# Patient Record
Sex: Female | Born: 1940 | Race: White | Hispanic: No | Marital: Married | Smoking: Never smoker
Health system: Southern US, Community
[De-identification: ages and names within clinical notes are randomized; demographics above are authoritative.]

## PROBLEM LIST (undated history)

## (undated) DIAGNOSIS — I10 Essential (primary) hypertension: Secondary | ICD-10-CM

## (undated) DIAGNOSIS — N289 Disorder of kidney and ureter, unspecified: Secondary | ICD-10-CM

## (undated) DIAGNOSIS — E785 Hyperlipidemia, unspecified: Secondary | ICD-10-CM

## (undated) DIAGNOSIS — I272 Pulmonary hypertension, unspecified: Secondary | ICD-10-CM

## (undated) DIAGNOSIS — Z8673 Personal history of transient ischemic attack (TIA), and cerebral infarction without residual deficits: Secondary | ICD-10-CM

## (undated) DIAGNOSIS — I340 Nonrheumatic mitral (valve) insufficiency: Secondary | ICD-10-CM

## (undated) DIAGNOSIS — R609 Edema, unspecified: Secondary | ICD-10-CM

## (undated) HISTORY — PX: OTHER SURGICAL HISTORY: SHX169

## (undated) HISTORY — DX: Essential (primary) hypertension: I10

## (undated) HISTORY — PX: TONSILLECTOMY: SUR1361

## (undated) HISTORY — DX: Disorder of kidney and ureter, unspecified: N28.9

## (undated) HISTORY — PX: GALLBLADDER SURGERY: SHX652

## (undated) HISTORY — DX: Nonrheumatic mitral (valve) insufficiency: I34.0

## (undated) HISTORY — PX: TOTAL ABDOMINAL HYSTERECTOMY: SHX209

## (undated) HISTORY — DX: Hyperlipidemia, unspecified: E78.5

## (undated) HISTORY — DX: Personal history of transient ischemic attack (TIA), and cerebral infarction without residual deficits: Z86.73

## (undated) HISTORY — DX: Pulmonary hypertension, unspecified: I27.20

## (undated) HISTORY — DX: Edema, unspecified: R60.9

---

## 2003-10-22 ENCOUNTER — Other Ambulatory Visit: Payer: Self-pay

## 2004-10-16 ENCOUNTER — Ambulatory Visit: Payer: Self-pay | Admitting: Unknown Physician Specialty

## 2005-05-12 ENCOUNTER — Emergency Department: Payer: Self-pay | Admitting: Emergency Medicine

## 2005-11-10 ENCOUNTER — Ambulatory Visit: Payer: Self-pay | Admitting: Unknown Physician Specialty

## 2006-03-23 ENCOUNTER — Ambulatory Visit: Payer: Self-pay | Admitting: Gastroenterology

## 2006-04-05 ENCOUNTER — Emergency Department: Payer: Self-pay | Admitting: Emergency Medicine

## 2007-03-10 ENCOUNTER — Emergency Department: Payer: Self-pay

## 2007-03-29 ENCOUNTER — Ambulatory Visit: Payer: Self-pay | Admitting: Unknown Physician Specialty

## 2007-10-19 ENCOUNTER — Ambulatory Visit: Payer: Self-pay | Admitting: Cardiovascular Disease

## 2007-12-01 ENCOUNTER — Inpatient Hospital Stay: Payer: Self-pay | Admitting: Cardiovascular Disease

## 2007-12-01 ENCOUNTER — Other Ambulatory Visit: Payer: Self-pay

## 2008-03-29 ENCOUNTER — Ambulatory Visit: Payer: Self-pay | Admitting: Unknown Physician Specialty

## 2008-04-18 ENCOUNTER — Ambulatory Visit: Payer: Self-pay | Admitting: Internal Medicine

## 2008-04-27 ENCOUNTER — Ambulatory Visit: Payer: Self-pay | Admitting: Internal Medicine

## 2008-05-19 ENCOUNTER — Ambulatory Visit: Payer: Self-pay | Admitting: Internal Medicine

## 2008-06-19 ENCOUNTER — Ambulatory Visit: Payer: Self-pay | Admitting: Internal Medicine

## 2008-07-27 ENCOUNTER — Ambulatory Visit: Payer: Self-pay | Admitting: Internal Medicine

## 2008-08-13 ENCOUNTER — Ambulatory Visit: Payer: Self-pay | Admitting: Urology

## 2008-08-19 ENCOUNTER — Ambulatory Visit: Payer: Self-pay | Admitting: Internal Medicine

## 2008-09-18 ENCOUNTER — Ambulatory Visit: Payer: Self-pay | Admitting: Internal Medicine

## 2008-10-19 ENCOUNTER — Ambulatory Visit: Payer: Self-pay | Admitting: Internal Medicine

## 2008-11-19 ENCOUNTER — Ambulatory Visit: Payer: Self-pay | Admitting: Internal Medicine

## 2008-12-17 ENCOUNTER — Ambulatory Visit: Payer: Self-pay | Admitting: Internal Medicine

## 2009-02-04 ENCOUNTER — Ambulatory Visit: Payer: Self-pay | Admitting: Internal Medicine

## 2009-02-11 ENCOUNTER — Ambulatory Visit: Payer: Self-pay | Admitting: Internal Medicine

## 2009-02-16 ENCOUNTER — Ambulatory Visit: Payer: Self-pay | Admitting: Internal Medicine

## 2009-03-04 ENCOUNTER — Ambulatory Visit: Payer: Self-pay | Admitting: Internal Medicine

## 2009-03-19 ENCOUNTER — Ambulatory Visit: Payer: Self-pay | Admitting: Internal Medicine

## 2009-04-04 ENCOUNTER — Ambulatory Visit: Payer: Self-pay | Admitting: Internal Medicine

## 2009-04-11 ENCOUNTER — Ambulatory Visit: Payer: Self-pay | Admitting: Unknown Physician Specialty

## 2009-04-18 ENCOUNTER — Ambulatory Visit: Payer: Self-pay | Admitting: Internal Medicine

## 2009-05-28 ENCOUNTER — Ambulatory Visit: Payer: Self-pay | Admitting: Unknown Physician Specialty

## 2009-06-06 ENCOUNTER — Ambulatory Visit: Payer: Self-pay | Admitting: Internal Medicine

## 2009-06-21 ENCOUNTER — Ambulatory Visit: Payer: Self-pay | Admitting: Podiatry

## 2009-07-08 ENCOUNTER — Ambulatory Visit: Payer: Self-pay | Admitting: Internal Medicine

## 2009-07-19 ENCOUNTER — Ambulatory Visit: Payer: Self-pay | Admitting: Internal Medicine

## 2009-08-19 ENCOUNTER — Ambulatory Visit: Payer: Self-pay | Admitting: Internal Medicine

## 2009-10-19 ENCOUNTER — Ambulatory Visit: Payer: Self-pay | Admitting: Internal Medicine

## 2009-11-11 ENCOUNTER — Ambulatory Visit: Payer: Self-pay | Admitting: Internal Medicine

## 2009-11-19 ENCOUNTER — Ambulatory Visit: Payer: Self-pay | Admitting: Internal Medicine

## 2009-12-02 ENCOUNTER — Encounter: Payer: Self-pay | Admitting: Cardiovascular Disease

## 2009-12-02 LAB — CONVERTED CEMR LAB
BUN: 42 mg/dL
Chloride: 101 meq/L
Cholesterol: 152 mg/dL
HDL: 31 mg/dL
Hgb A1c MFr Bld: 7.2 %
LDL Cholesterol: 82.8 mg/dL
Potassium: 5.4 meq/L

## 2009-12-19 ENCOUNTER — Encounter: Payer: Self-pay | Admitting: Cardiovascular Disease

## 2009-12-31 ENCOUNTER — Ambulatory Visit: Payer: Self-pay | Admitting: Cardiovascular Disease

## 2009-12-31 DIAGNOSIS — E785 Hyperlipidemia, unspecified: Secondary | ICD-10-CM | POA: Insufficient documentation

## 2009-12-31 DIAGNOSIS — I080 Rheumatic disorders of both mitral and aortic valves: Secondary | ICD-10-CM

## 2009-12-31 DIAGNOSIS — R609 Edema, unspecified: Secondary | ICD-10-CM | POA: Insufficient documentation

## 2009-12-31 DIAGNOSIS — I10 Essential (primary) hypertension: Secondary | ICD-10-CM | POA: Insufficient documentation

## 2009-12-31 DIAGNOSIS — E119 Type 2 diabetes mellitus without complications: Secondary | ICD-10-CM | POA: Insufficient documentation

## 2010-01-07 ENCOUNTER — Encounter: Payer: Self-pay | Admitting: Cardiovascular Disease

## 2010-01-10 ENCOUNTER — Ambulatory Visit: Payer: Self-pay | Admitting: Internal Medicine

## 2010-01-17 ENCOUNTER — Ambulatory Visit: Payer: Self-pay | Admitting: Unknown Physician Specialty

## 2010-01-18 ENCOUNTER — Ambulatory Visit: Payer: Self-pay | Admitting: Internal Medicine

## 2010-03-05 ENCOUNTER — Ambulatory Visit: Payer: Self-pay | Admitting: Cardiovascular Disease

## 2010-03-24 ENCOUNTER — Ambulatory Visit: Payer: Self-pay | Admitting: Orthopedic Surgery

## 2010-03-27 ENCOUNTER — Telehealth: Payer: Self-pay | Admitting: Cardiovascular Disease

## 2010-03-28 ENCOUNTER — Telehealth: Payer: Self-pay | Admitting: Cardiovascular Disease

## 2010-03-31 ENCOUNTER — Encounter: Payer: Self-pay | Admitting: Cardiovascular Disease

## 2010-03-31 ENCOUNTER — Ambulatory Visit: Payer: Self-pay | Admitting: Orthopedic Surgery

## 2010-04-01 ENCOUNTER — Inpatient Hospital Stay: Payer: Self-pay | Admitting: Orthopedic Surgery

## 2010-05-09 ENCOUNTER — Telehealth: Payer: Self-pay | Admitting: Cardiovascular Disease

## 2010-05-13 ENCOUNTER — Ambulatory Visit: Payer: Self-pay | Admitting: Cardiovascular Disease

## 2010-05-13 ENCOUNTER — Ambulatory Visit: Payer: Self-pay | Admitting: Internal Medicine

## 2010-05-13 DIAGNOSIS — R42 Dizziness and giddiness: Secondary | ICD-10-CM

## 2010-05-15 LAB — CONVERTED CEMR LAB
BUN: 28 mg/dL — ABNORMAL HIGH (ref 6–23)
Creatinine, Ser: 1.77 mg/dL — ABNORMAL HIGH (ref 0.40–1.20)
Potassium: 5.4 meq/L — ABNORMAL HIGH (ref 3.5–5.3)

## 2010-05-19 ENCOUNTER — Ambulatory Visit: Payer: Self-pay | Admitting: Internal Medicine

## 2010-05-26 ENCOUNTER — Telehealth: Payer: Self-pay | Admitting: Cardiovascular Disease

## 2010-05-29 ENCOUNTER — Ambulatory Visit: Payer: Self-pay | Admitting: Cardiovascular Disease

## 2010-05-30 LAB — CONVERTED CEMR LAB
Glucose, Bld: 237 mg/dL — ABNORMAL HIGH (ref 70–99)
Potassium: 4.7 meq/L (ref 3.5–5.3)
Sodium: 132 meq/L — ABNORMAL LOW (ref 135–145)

## 2010-06-25 ENCOUNTER — Ambulatory Visit: Payer: Self-pay | Admitting: Unknown Physician Specialty

## 2010-06-27 ENCOUNTER — Telehealth: Payer: Self-pay | Admitting: Cardiovascular Disease

## 2010-09-09 ENCOUNTER — Ambulatory Visit: Payer: Self-pay | Admitting: Internal Medicine

## 2010-09-18 ENCOUNTER — Ambulatory Visit: Payer: Self-pay | Admitting: Internal Medicine

## 2010-09-26 ENCOUNTER — Telehealth: Payer: Self-pay | Admitting: Cardiovascular Disease

## 2010-10-07 ENCOUNTER — Ambulatory Visit: Payer: Self-pay | Admitting: Cardiovascular Disease

## 2010-10-07 ENCOUNTER — Encounter: Payer: Self-pay | Admitting: Cardiovascular Disease

## 2010-11-03 ENCOUNTER — Inpatient Hospital Stay: Payer: Self-pay | Admitting: Internal Medicine

## 2010-11-17 ENCOUNTER — Ambulatory Visit: Payer: Self-pay | Admitting: Internal Medicine

## 2010-11-17 ENCOUNTER — Encounter: Payer: Self-pay | Admitting: Cardiovascular Disease

## 2010-11-18 NOTE — Progress Notes (Signed)
Summary: PHI  PHI   Imported By: Harlon Flor 01/01/2010 11:31:12  _____________________________________________________________________  External Attachment:    Type:   Image     Comment:   External Document

## 2010-11-18 NOTE — Progress Notes (Signed)
Summary: MEDICATION QUESTION  Phone Note Call from Patient Call back at Home Phone [phone]   Caller: self Call For: [facility] Summary of Call: WAS PRESCRIBED A LIQUID FOR HER POTASSIUM-WOULD LIKE TO KNOW HOW OFTEN TO TAKE IT Initial call taken by: Harlon Flor,  March 28, 2010 9:42 AM  Follow-up for Phone Call        Called spoke with pt, advised to take 30 grams today and 30 grams tomorrow. Pt states she was given 2 bottles of the medication.  Advised pt to call pharmacy and clarify how rx was filled as to how much of each bottle she should take today and tomorrow. Follow-up by: Cloyde Reams RN,  March 28, 2010 9:53 AM

## 2010-11-18 NOTE — Assessment & Plan Note (Signed)
Summary: DIZZINESS ORTHOSTATIC/ALT   Visit Type:  Follow-up Referring Provider:  Mariah Milling Primary Provider:  Francia Greaves, MD  CC:  c/o dizziness when stands up for  several months..  History of Present Illness: Alexis Joseph is a very pleasant 70 year old woman, patient of Dr. 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  She reports that since her surgery at the end of June, she has difficulty standing up without significant dizziness. She has had this for over one year ago it is worse recently. She called last week on Friday and we suggested that she decrease her clonidine to one half tab 3 times a day and hold her Lasix. She was orthostatic by the numbers provided with systolic pressure less than 100 with standing.  She is uncertain of holding the Lasix and decrease in her clonidine as needed better though she believes it has helped. over the weekend, her blood pressure was elevated greater than 150 and she took a full clonidine instead of a half. Typically she takes 1-1/2 clonidine pills . she does not have any symptoms with sitting or lying down. Symptoms only with standing, typically they go away after several seconds though she reports that her symptoms are quite severe.  her blood pressures over the past week at home have typically been in the 130 to 150 range, occasional 160 systolic.    Current Medications (verified): 1)  Levemir Flexpen 100 Unit/ml Soln (Insulin Detemir) .... As Directed 2)  Apidra Solostar 100 Unit/ml Soln (Insulin Glulisine) .... As Directed 3)  Align  Caps (Probiotic Product)  .Marland Kitchen.. 1 By Mouth Once Daily 4)  Tandem 162-115.2 Mg Caps (Ferrous Fum-Iron Polysacch) .Marland Kitchen.. 1 By Mouth Two Times A Day 5)  Plavix 75 Mg Tabs (Clopidogrel Bisulfate) .... Take One Tablet By Mouth Daily 6)  Clonidine Hcl 0.1 Mg Tabs (Clonidine Hcl) .Marland Kitchen.. 1 1/2  Tablet By Mouth Three Times A Day 7)  Aspirin 81 Mg Tbec (Aspirin) .... Take One Tablet By Mouth Daily 8)  Hydralazine Hcl 10 Mg Tabs (Hydralazine Hcl) .Marland Kitchen.. 1 By Mouth Three Times A Day 9)  Aciphex 20 Mg Tbec (Rabeprazole Sodium) .Marland Kitchen.. 1 By Mouth Two Times A Day 10)  Lyrica 75 Mg Caps (Pregabalin) .Marland Kitchen.. 1 By Mouth Two Times A Day 11)  Pravastatin Sodium 40 Mg Tabs (Pravastatin Sodium) .... Take One Tablet By Mouth Daily At Bedtime 12)  Co Q-10 100 Mg Caps (Coenzyme Q10) .Marland Kitchen.. 1 By Mouth Once Daily 13)  Omega-3 Fish Oil 1200 Mg Caps (Omega-3 Fatty Acids) .Marland Kitchen.. 1 By Mouth Once Daily 14)  Vitamin D 1000 Unit Tabs (Cholecalciferol) .Marland Kitchen.. 1 By Mouth Once Daily 15)  Kayexalate  Powd (Sodium Polystyrene Sulfonate) .... Take 30 Grams By Mouth X 2 Days  Allergies (verified): 1)  ! Cipro 2)  ! Amoxicillin 3)  ! Phenergan  Past History:  Past Medical History: Last updated: 12/20/2009 Edema Hypertension Hyperlipidemia Nephropathy History of TIA x 2 Mild to moderate mitral and tricupsid regurgitation Diastolic relaxation abnormality Mild pulmonary hypertension Diabetes  Past Surgical History: Last updated: 12/31/2009 gallbladder Abdominal Hysterectomy-Total Tonsillectomy  Family History: Last updated: 12/31/2009 Father: living 82: healthy, altimizers Mother: deceased 17: MI  Social History: Last  updated: 12/31/2009 Retired  Married  Tobacco Use - No.  Alcohol Use - no Regular Exercise - yes Drug Use - no  Risk Factors: Alcohol Use: 0 (12/31/2009) Caffeine Use: 1-2 (12/31/2009) Exercise: yes (12/31/2009)  Risk Factors: Smoking Status: never (12/31/2009)  Review of Systems  The patient denies fever, weight loss, weight  gain, vision loss, decreased hearing, hoarseness, chest pain, syncope, dyspnea on exertion, peripheral edema, prolonged cough, abdominal pain, incontinence, muscle weakness, depression, and enlarged lymph nodes.         Dizzy with standing.  Vital Signs:  Patient profile:   70 year old female Height:      63 inches Weight:      176 pounds BMI:     31.29 Pulse rate:   84 / minute BP supine:   192 / 100  (left arm) BP sitting:   184 / 92  (left arm) BP standing:   150 / 82  (left arm) Cuff size:   regular  Vitals Entered By: Bishop Dublin, CMA (May 13, 2010 11:22 AM)  Physical Exam  General:  Well developed, well nourished, in no acute distress. Head:  normocephalic and atraumatic Neck:  Neck supple, no JVD. No masses, thyromegaly or abnormal cervical nodes. Lungs:  Clear bilaterally to auscultation and percussion. Heart:  Non-displaced PMI, chest non-tender; regular rate and rhythm, S1, S2 without murmurs, rubs or gallops. Carotid upstroke normal, no bruit.  Pedals normal pulses. No edema, no varicosities. Abdomen:  Bowel sounds positive; abdomen soft and non-tender without masses Msk:  Back normal, normal gait. Muscle strength and tone normal. Pulses:  pulses normal in all 4 extremities Extremities:  No clubbing or cyanosis. Neurologic:  Alert and oriented x 3. Skin:  Intact without lesions or rashes. Psych:  Normal affect.   Impression & Recommendations:  Problem # 1:  ORTHOSTATIC DIZZINESS (ICD-780.4) her blood pressure was dropping quite significantly with standing last week with systolics less than 100. We held her Lasix, cut back on her clonidine. Her symptoms have persisted and her blood pressure is higher today.  On repeat with sitting, her blood pressure is in the 140s systolic. We have ordered a basic metabolic panel to make sure that she is not dehydrated. If her labs look appropriate, I suspect we may have to change her blood pressure medications and shift away  from vasodilator's. One option would be to change her clonidine and/or  hydralazine to tekturna or bystolic.  she has no lower extremity edema and we have told her to continue to hold her diuretic.  Orders: T-Basic Metabolic Panel [id number])  Problem # 2:  EDEMA (ICD-782.3) she currently has no edema and we have held her Lasix.  Problem # 3:  HYPERTENSION, BENIGN (ICD-401.1) I've asked her to monitor her her blood pressure through the next week. She is to call us with her blood pressure numbers. I have suggested that she stay on a hydralazine 10 mg b.i.d. for now but she can take an extra tablet as needed for systolic pressure greater than 150.  Her updated medication list for this problem includes:    Clonidine Hcl 0.1 Mg Tabs (Clonidine hcl) .Marland Kitchen... 1/2  tablet by mouth three times a day    Aspirin 81 Mg Tbec (Aspirin) .Marland Kitchen... Take one tablet by mouth daily    Hydralazine Hcl 10 Mg Tabs (Hydralazine hcl) .Marland Kitchen... Take one tablet by mouth two times a day  Patient Instructions: 1)  Your physician recommends that you schedule a follow-up appointment  in: 2 months  2)  Your physician recommends that you have  lab work today (BMP) 3)  Your physician has recommended you make the following change in your medication: Start taking hydralazine two times a day you can also take one extra hydralazine if BP is elevated.   Prescriptions: HYDRALAZINE HCL 10 MG TABS (HYDRALAZINE HCL) Take one tablet by mouth three times a day  #90 x 2   Entered by:   Benedict Needy, RN   Authorized by:   Dossie Arbour MD   Signed by:   Benedict Needy, RN on 05/13/2010   Method used:   [facility]        [facility] [phone][facility] (retail)       [address] [place], [place]  [address]       Ph: [phone] or [phone]       Fax: [phone]   RxID:   [id number]

## 2010-11-18 NOTE — Assessment & Plan Note (Signed)
Summary: SURGICAL CLEARANCE   Visit Type:  surgical clearance Referring Provider:  Mariah Milling Primary Provider:  Francia Greaves, MD  CC:  don't feel good/  no cp/ no sob/ little edema in ankles and feet.  History of Present Illness: [name] is a very pleasant 70 year old woman, patient of Dr. Lin Givens and Ellis Savage, with a history of chronic renal insufficiency, diabetes, hypertension, hyperlipidemia, TIA x2 with mild to moderate mitral and tricuspid regurgitation, diastolic relaxation abnormality, mild pulmonary hypertension with right ventricular systolic pressures of 43 based on echocardiography in December 2008 who presents for preoperative evaluation prior to right total knee replacement.  overall she states that she has been doing well. She has been as active as she can be given that her right knee causes significant pain with exertion. She denies any significant chest pain, shortness of breath. Her lower stomach swelling has been very mild and she does take a diuretic on an occasional basis. Her blood pressure has also been well controlled on her current medication regimen. She denies any dizziness or lightheadedness.  Current Problems (verified): 1)  Hypertension, Benign  (ICD-401.1) 2)  Edema  (ICD-782.3) 3)  Mitral Regurgitation  (ICD-396.3) 4)  Other and Unspecified Hyperlipidemia  (ICD-272.4) 5)  Dm  (ICD-250.00) 6)  Unspecified Essential Hypertension  (ICD-401.9)  Current Medications (verified): 1)  Levemir Flexpen 100 Unit/ml Soln (Insulin Detemir) .... As Directed 2)  Apidra Solostar 100 Unit/ml Soln (Insulin Glulisine) .... As Directed 3)  Align  Caps (Probiotic Product) .Marland Kitchen.. 1 By Mouth Once Daily 4)  Tandem 162-115.2 Mg Caps (Ferrous Fum-Iron Polysacch) .Marland Kitchen.. 1 By Mouth Two Times A Day 5)  Plavix 75 Mg Tabs (Clopidogrel Bisulfate) .... Take One Tablet By Mouth Daily 6)  Clonidine Hcl 0.1 Mg Tabs (Clonidine Hcl) .Marland Kitchen.. 1 1/2  Tablet By Mouth Three Times A Day 7)  Aspirin 81 Mg  Tbec (Aspirin) .... Take One Tablet By Mouth Daily 8)  Hydralazine Hcl 10 Mg Tabs (Hydralazine Hcl) .Marland Kitchen.. 1 By Mouth Three Times A Day 9)  Aciphex 20 Mg Tbec (Rabeprazole Sodium) .Marland Kitchen.. 1 By Mouth Two Times A Day 10)  Lyrica 75 Mg Caps (Pregabalin) .Marland Kitchen.. 1 By Mouth Two Times A Day 11)  Pravastatin Sodium 40 Mg Tabs (Pravastatin Sodium) .... Take One Tablet By Mouth Daily At Bedtime 12)  Co Q-10 100 Mg Caps (Coenzyme Q10) .Marland Kitchen.. 1 By Mouth Once Daily 13)  Omega-3 Fish Oil 1200 Mg Caps (Omega-3 Fatty Acids) .Marland Kitchen.. 1 By Mouth Once Daily 14)  Vitamin D 1000 Unit Tabs (Cholecalciferol) .Marland Kitchen.. 1 By Mouth Once Daily  Allergies (verified): 1)  ! Cipro 2)  ! Amoxicillin 3)  ! Phenergan  Past History:  Past Medical History: Last updated: 12/20/2009 Edema Hypertension Hyperlipidemia Nephropathy History of TIA x 2 Mild to moderate mitral and tricupsid regurgitation Diastolic relaxation abnormality Mild pulmonary hypertension Diabetes  Past Surgical History: Last updated: 12/31/2009 gallbladder Abdominal Hysterectomy-Total Tonsillectomy  Family History: Last updated: 12/31/2009 Father: living 14: healthy, altimizers Mother: deceased 37: MI  Social History: Last updated: 12/31/2009 Retired  Married  Tobacco Use - No.  Alcohol Use - no Regular Exercise - yes Drug Use - no  Risk Factors: Alcohol Use: 0 (12/31/2009) Caffeine Use: 1-2 (12/31/2009) Exercise: yes (12/31/2009)  Risk Factors: Smoking Status: never (12/31/2009)  Review of Systems  The patient denies fever, weight loss, weight gain, vision loss, decreased hearing, hoarseness, chest pain, syncope, dyspnea on exertion, peripheral edema, prolonged cough, abdominal pain, incontinence, muscle weakness, depression, and enlarged  lymph nodes.    Vital Signs:  Patient profile:   69 year old female Height:      63 inches Weight:      181.25 pounds BMI:     32.22 Pulse rate:   80 / minute Pulse rhythm:   regular BP sitting:    100 / 56  (right arm) Cuff size:   regular  Vitals Entered By: Mercer Pod (Mar 05, 2010 3:22 PM)  Physical Exam  General:  HEENT exam is benign the oropharynx is clear, neck is supple with no JVP or carotid bruits, heart sounds are regular with S1-S2 and no murmurs appreciated, lungs are clear to auscultation with no wheezes rales, abdominal exam is benign, no significant lower extremity edema, neurologic exam is grossly nonfocal, skin is warm and dry, pulses are equal and symmetrical in her upper and lower extremities.    Impression & Recommendations:  Problem # 1:  PRE-OPERATIVE CARDIAC EXAM (ICD-V72.81) she has no history of coronary artery disease. She does have mild pulmonary hypertension based on A. echocardiogram with normal systolic function. She has been using a diuretic on a p.r.n. basis. She has underlying renal insufficiency with baseline creatinine in the low 2 range. This has been stable. Her blood pressure has been well-controlled. She has no symptoms suggestive of angina.  I feel that she would be an acceptable risk for a right total knee replacement with any type of anesthesia required including general anesthesia or spinal blockade.  Given her history of mild pulmonary hypertension and underlying mild valvular disease, I would recommend that during her right total knee surgery that IV fluids be kept to a minimum. Her heart weight is well-controlled and we will hold off on starting her on beta blockade.  she does have labile blood pressures have asked her to monitor her pressures at home. Some of her elevated pressures may be due to her underlying right knee pain. If she has hypotension, I have asked her to contact us as we may need to cut back on some of her clonidine.  Her updated medication list for this problem includes:    Plavix 75 Mg Tabs (Clopidogrel bisulfate) .Marland Kitchen... Take one tablet by mouth daily    Aspirin 81 Mg Tbec (Aspirin) .Marland Kitchen... Take one tablet by mouth  daily  Appended Document: SURGICAL CLEARANCE she will need to stop Plavix 5 days prior to her total knee surgery. We'll call her with the message to remind her.  Appended Document: SURGICAL CLEARANCE Pt is aware to hold Plavix 5 days prior to surgery.  EWJ

## 2010-11-18 NOTE — Progress Notes (Signed)
  Phone Note Call from Patient   Caller: Patient Summary of Call: Ne[facility] a refill [facility] [address]. Initial call taken by: Bishop Dublin, CMA,  September 26, 2010 10:40 AM  Follow-up for Phone Call        Phone Call Completed Follow-up by: Bishop Dublin, CMA,  September 26, 2010 10:41 AM    Prescriptions: CLONIDINE HCL 0.1 MG TABS (CLONIDINE HCL) 1 1/2  tablet by mouth three times a day  #135 x 6   Entered by:   Bishop Dublin, CMA   Authorized by:   Dossie Arbour MD   Signed by:   Bishop Dublin, CMA on 09/26/2010   Method used:   Electronically to        [facility] [phone]* (retail)       [address] [place], [place]  [address]       Ph: [phone] or [phone]       Fax: [phone]   RxID:   [id number]

## 2010-11-18 NOTE — Progress Notes (Signed)
Summary: Refill tecturna  Phone Note Refill Request   [facility]  (pt was given samples of Tekturna at last visit and would like a prescription called in.)  Initial call taken by: [place] Carbo,  June 27, 2010 12:37 PM  Follow-up for Phone Call        Spoke with patient and she is not sure of Teckturna dose. Do you want her to continue her clonidine as well? What dose of teckturna?  BP readings today 96/65 a.m. at noon 130/80. Follow-up by: Bishop Dublin, CMA,  June 27, 2010 2:17 PM    New/Updated Medications: TEKTURNA 150 MG TABS (ALISKIREN FUMARATE) Take one tablet by mouth daily Prescriptions: TEKTURNA 150 MG TABS (ALISKIREN FUMARATE) Take one tablet by mouth daily  #30 x 6   Entered by:   Bishop Dublin, CMA   Authorized by:   Dossie Arbour MD   Signed by:   Bishop Dublin, CMA on 06/27/2010   Method used:   Electronically to        [facility] [phone]* (retail)       [address] [place], [place]  [address]       Ph: [phone] or [phone]       Fax: [phone]   RxID:   [id number]

## 2010-11-18 NOTE — Progress Notes (Signed)
Summary: ELEVATED K+  Phone Note From Other Clinic   Caller: Velna Hatchet (Dr. Rosita Kea) Call For: [facility] Summary of Call: Faxed abnormal lab results. K+  5.5 on 02/21/10 and redrawn 03/24/10 5.8.  Called spoke with pt denies K+ supplements or prescriptions, salt substitutes, citrus fruits, eats bananas occasionally.  Medication list reviewed pt not currently on andy diuretics. Surgeon asking for you to assist in decreasing K+ prior to knee replacement on 6/14. Please advise.  Initial call taken by: Benedict Needy, RN,  March 27, 2010 3:33 PM  Follow-up for Phone Call        Katexelate 30 gr on 6/10 and 6/11 with BMP on 6/13 in Am before surgery on 6/14  Spoke with pt advised of RX and instructions. Will call surgeon and order stat BMP on 6/13 with preop labs. Benedict Needy, RN  March 27, 2010 5:28 PM   Additional Follow-up for Phone Call Additional follow up Details #1::        Called spoke with Velna Hatchet at Dr Rosita Kea office advised of  Kayexalate rx and instructions to recheck K+ stat on 03/31/10 in case additional doses needed to decr K+.  Faxed surgical clearance note, EKG and Phone note with Kayexalate rx. Additional Follow-up by: Cloyde Reams RN,  March 28, 2010 11:11 AM    New/Updated Medications: KAYEXALATE  POWD (SODIUM POLYSTYRENE SULFONATE) Take 30 grams by mouth x 2 days Prescriptions: KAYEXALATE  POWD (SODIUM POLYSTYRENE SULFONATE) Take 30 grams by mouth x 2 days  #60 x 0   Entered by:   Benedict Needy, RN   Authorized by:   Dossie Arbour MD   Signed by:   Benedict Needy, RN on 03/27/2010   Method used:   [facility]ronically to        [facility] [phone]* (retail)       [address] [place], [place]  [address]       Ph: [phone] or [phone]       Fax: [phone]   RxID:   [id number]

## 2010-11-18 NOTE — Progress Notes (Signed)
Summary: SURGICAL CLEARANCE  Phone Note From Other Clinic Call back at [phone]   Caller: DIANE (PRE-ADMIT) Call For: Artrice Kraker Summary of Call: NEEDS SURGICAL CLEARANCE FOR A RIGHT TOTAL KNEE REPLACEMENT ON 6/14-ALSO, THEY ARE UNDER THE IMPRESSION THAT THE PT HAD A STRESS TEST IN MAY- I DO NOT SEE ONE Initial call taken by: Harlon Flor,  March 28, 2010 2:19 PM  Follow-up for Phone Call        Called spoke with Diane, advised no record of a stress test done on pt by [place] in May.  No orders in chart, or mention of in OV note.  Surgical clearance has been faxed to Dr Rosita Kea office.   Follow-up by: Cloyde Reams RN,  March 28, 2010 3:11 PM

## 2010-11-18 NOTE — Progress Notes (Signed)
Summary: BP ISSUES  Phone Note From Other Clinic   Caller: Nurse Summary of Call: PT IS UNDER [facility] FOR KNEE REPLACEMENT.  COURTNEY, NURSE, HAS SOME CONCERNS WITH BP.  IN THE AM BEFORE MEDS IT IS 70'S OVER 40'S.  AFTER MOVING AROUND SOME GOES TO 170'S OVER 80'S.  PCP DR. JEFFRIES WAS ADVISED AND HE HAS TOLD HER TO HOLD LASIX AND CLONIDINE IF BELOW 100 AND TO DECREASE HYDRALIAZAINE TO two times a day.  [facility] JUST WANTED DR. Deonna Krummel TO BE AWARE OF SAME. PT COMPLAINS OF BEING DIZZY.  NURSE CELL NUMBER [phone] Initial call taken by: Park Breed,  May 09, 2010 4:13 PM  Follow-up for Phone Call        Called [facility]'s [facility] nurse Toni Amend pt was orthostatic 180's/70's sitting and 128/60 standing. very dizzy. Benedict Needy, RN  May 09, 2010 4:46 PM   Spoke to [facility] nurse from [facility] per Dr. Mariah Milling hold lasix through weekend and  take 1/2 clonidine if BP >150/90 take whole dose two times a day and increase fluid intake.Toni Amend reported that pt seems to drink enough water but is not there all the time.  Nurse asked when  pt could be seen by Dr. Mariah Milling appointment made for Tuesday at 11:15.  Follow-up by: Benedict Needy, RN,  May 09, 2010 5:15 PM     Appended Document: BP ISSUES Can we see how her BP is running? Tim  Appended Document: BP ISSUES pt being seen in office this am

## 2010-11-18 NOTE — Miscellaneous (Signed)
Summary: labs from [facility]  Clinical Lists Changes  Observations: Added new observation of CO2 PLSM/SER: 29.3 meq/L (12/02/2009 9:34) Added new observation of CL SERUM: 101 meq/L (12/02/2009 9:34) Added new observation of K SERUM: 5.4 meq/L (12/02/2009 9:34) Added new observation of NA: 138.2 meq/L (12/02/2009 9:34) Added new observation of CREATININE: 2.1 mg/dL (16/07/9603 5:40) Added new observation of BUN: 42 mg/dL (98/08/9146 8:29) Added new observation of BG RANDOM: 137 mg/dL (56/21/3086 5:78) Added new observation of SGOT (AST): 24 units/L (12/02/2009 9:34) Added new observation of SGPT (ALT): 20 units/L (12/02/2009 9:34) Added new observation of HGBA1C: 7.2 % (12/02/2009 9:34) Added new observation of TRIGLYCERIDE: 191 mg/dL (46/96/2952 8:41) Added new observation of HDL: 31.0 mg/dL (32/44/0102 7:25) Added new observation of LDL: 82.8 mg/dL (36/64/4034 7:42) Added new observation of CHOLESTEROL: 152 mg/dL (59/56/3875 6:43)      -  Date:  12/02/2009    Cholesterol: 152    LDL: 82.8    HDL: 31.0    Triglycerides: 191    HgbA1c: 7.2    SGPT (ALT): 20    SGOT (AST): 24    BG Random: 137    BUN: 42    Creatinine: 2.1    Sodium: 138.2    Potassium: 5.4    Chloride: 101    CO2 Total: 29.3

## 2010-11-18 NOTE — Letter (Signed)
Summary: Medical Record Release  Medical Record Release   Imported By: Harlon Flor 01/10/2010 08:33:47  _____________________________________________________________________  External Attachment:    Type:   Image     Comment:   External Document

## 2010-11-18 NOTE — Assessment & Plan Note (Signed)
Summary: NP6/AMD   Visit Type:  New Patient Referring Provider:  Mariah Milling Primary Provider:  Francia Greaves, MD  CC:  no cp, no sob, and edema in ankles..  History of Present Illness: Ms. Neyman is a very pleasant 70 year old woman, patient of Dr. 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  She states that overall she has been doing well. She has stable edema and states that this has not been a problem. She is currently off her diuretic for quite some time. She has been taking clonidine 0.15 mg 3 times a day with hydralazine 10 mg 3 times a day. She is uncertain what her blood pressures are as she thought she would come down when in fact she only wrote down the time of day and not the actual blood pressure.  She has no new complaints. She did have to wear a boot on her leg for several months last year. She's had significant knee arthritis and has had 2 cortisone shots and is due to have a shot of "gel "in her knee in the future. She denies shortness of breath or chest discomfort.  Preventive Screening-Counseling & Management  Alcohol-Tobacco     Alcohol drinks/day: 0     Smoking Status: never  Caffeine-Diet-Exercise     Caffeine use/day: 1-2     Does Patient Exercise: yes     Type of exercise: walk      Drug Use:  no.    Current Problems (verified): 1)  Edema  (ICD-782.3) 2)  Mitral Regurgitation  (ICD-396.3) 3)  Other and Unspecified Hyperlipidemia  (ICD-272.4) 4)  Dm  (ICD-250.00) 5)  Unspecified Essential Hypertension  (ICD-401.9)  Current Medications (verified): 1)  Levemir Flexpen 100 Unit/ml Soln (Insulin Detemir) .... As Directed 2)  Apidra Solostar 100 Unit/ml  Soln (Insulin Glulisine) .... As Directed 3)  Align  Caps (Probiotic Product) .Marland Kitchen.. 1 By Mouth Once Daily 4)  Tandem 162-115.2 Mg Caps (Ferrous Fum-Iron Polysacch) .Marland Kitchen.. 1 By Mouth Two Times A Day 5)  Plavix 75 Mg Tabs (Clopidogrel Bisulfate) .... Take One Tablet By Mouth Daily 6)  Clonidine Hcl 0.1 Mg Tabs (Clonidine Hcl) .Marland Kitchen.. 1 1/2  Tablet By Mouth Three Times A Day 7)  Aspirin 81 Mg Tbec (Aspirin) .... Take One Tablet By Mouth Daily 8)  Hydralazine Hcl 10 Mg Tabs (Hydralazine Hcl) .Marland Kitchen.. 1 By Mouth Three Times A Day 9)  Aciphex 20 Mg Tbec (Rabeprazole Sodium) .Marland Kitchen.. 1 By Mouth Two Times A Day 10)  Lyrica 75 Mg Caps (Pregabalin) .Marland Kitchen.. 1 By Mouth Once Daily 11)  Pravastatin Sodium 40 Mg Tabs (Pravastatin Sodium) .... Take One Tablet By Mouth Daily At Bedtime 12)  Co Q-10 100 Mg Caps (Coenzyme Q10) .Marland Kitchen.. 1 By Mouth Once Daily 13)  Omega-3 Fish Oil 1200 Mg Caps (Omega-3 Fatty Acids) .Marland Kitchen.. 1 By Mouth Once Daily 14)  Vitamin D 1000 Unit Tabs (Cholecalciferol) .Marland Kitchen.. 1 By Mouth Once Daily  Allergies (verified): 1)  ! Cipro 2)  ! Amoxicillin 3)  ! Phenergan  Past History:  Past Surgical History: gallbladder Abdominal Hysterectomy-Total Tonsillectomy  Family History: Father: living 11: healthy, altimizers Mother: deceased 57: MI  Social History: Retired  Married  Tobacco Use - No.  Alcohol Use - no Regular Exercise - yes  Drug Use - no Alcohol drinks/day:  0 Smoking Status:  never Caffeine use/day:  1-2 Does Patient Exercise:  yes Drug Use:  no  Review of Systems       The patient complains of weight gain.  The patient denies fever, vision loss, decreased hearing, hoarseness, chest pain, syncope, dyspnea on exertion, peripheral edema, prolonged cough, abdominal pain, incontinence, muscle weakness, depression, and enlarged lymph nodes.         Knee pain  Vital Signs:  Patient profile:   70 year old female Height:      63 inches Weight:      192.50 pounds BMI:     34.22 Pulse rate:    85 / minute Pulse rhythm:   regular BP sitting:   144 / 78  (left arm) Cuff size:   regular  Vitals Entered By: Mercer Pod (December 31, 2009 10:59 AM)  Physical Exam  General:  well-appearing woman in no apparent distress, H. EENT exam is benign, neck is supple with no JVP or carotid bruits, heart sounds regular with S1-S2 no murmurs appreciated, lungs are clear to auscultation with no wheezes or rales, abdominal exam is benign, no significant lower extremity edema, pulses are equal and symmetrical in her upper and lower extremities, neurologic exam is grossly nonfocal, skin is warm and dry.    EKG  Procedure date:  12/31/2009  Findings:      normal sinus rhythm with rate 85 beats per minute, no significant ST or T wave changes.  Impression & Recommendations:  Problem # 1:  OTHER AND UNSPECIFIED HYPERLIPIDEMIA (ICD-272.4) she has been on pravastatin 40 mg daily and will try to obtain a recent lipid panel from Dr. Lin Givens for our records. She has no known coronary artery disease though does have a history of TIAs.  Her updated medication list for this problem includes:    Pravastatin Sodium 40 Mg Tabs (Pravastatin sodium) .Marland Kitchen... 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 She does not appear to have significant lower extremity edema based on today's exam. She is currently off diuretic regiment and is under the guidance of Dr. Lin Givens and Dr.Kiser for her renal insufficiency.  She has no signs of heart failure based on clinical exam and is not symptomatic.  Problem # 3:  DM (ICD-250.00) we'll try to obtain her most recent hemoglobin A1c from records.  Her updated medication list for this problem includes:    Levemir Flexpen 100 Unit/ml Soln  (Insulin detemir) .Marland Kitchen... As directed    Apidra Solostar 100 Unit/ml Soln (Insulin glulisine) .Marland Kitchen... As directed    Aspirin 81 Mg Tbec (Aspirin) .Marland Kitchen... Take one tablet by mouth daily  Problem # 4:  HYPERTENSION, BENIGN (ICD-401.1) Blood pressure is borderline elevated today at 144/78. We've asked her to monitor her blood pressures at home. She has been monitoring it but has not been riding down her numbers. She will call us later this week to give [place] some numbers. She is asked [place] to renew her clonidine and her hydralazine. If her pressures are elevated, we could increase her hydralazine to 25 mg t.i.d. If her pressures are well controlled, we would refill at its current dose of 10 mg t.i.d.  Of note she has increased her clonidine to 0.2 mg t.i.d. On her own.  Her updated medication list for this problem includes:    Clonidine Hcl 0.2 Mg Tabs (Clonidine hcl) .Marland Kitchen... Take one tablet by mouth three times a day    Aspirin 81 Mg Tbec (Aspirin) .Marland Kitchen... Take one tablet by mouth daily    Hydralazine Hcl 10 Mg Tabs (Hydralazine hcl) .Marland Kitchen... 1 by mouth three times a day  Patient Instructions: 1)  Your physician recommends that you schedule a follow-up appointment in: 6 months 2)  Your physician has recommended you make the following change in your medication: clonidine 0.2 mg three times a day- please call when you are need of a refill, hydralazine- please call in 1-2 weeks to let us know how your BP is doing.  If it is ok, we will leave your hydralazine at 10 mg, if it is elevated we will increase your hydralazine to 25 mg

## 2010-11-18 NOTE — Progress Notes (Signed)
Summary: BP  Phone Note From Other Clinic   Caller: Toni Amend Genevieve Norlander) Summary of Call: Message left from [facility] Nurse that Pt's BP still a problem. Attempted to call nurse back LMOM TCB.  Initial call taken by: Benedict Needy, RN,  May 26, 2010 2:10 PM  Follow-up for Phone Call        [place] nurse would like to see pt 2 times this week due to her eratic BPs Follow-up by: Benedict Needy, RN,  May 26, 2010 2:20 PM

## 2010-11-19 ENCOUNTER — Ambulatory Visit: Payer: Self-pay | Admitting: Internal Medicine

## 2010-11-20 NOTE — Assessment & Plan Note (Signed)
Summary: 6 month F/U   Visit Type:  Follow-up Referring Provider:  Mariah Joseph Primary Provider:  Francia Greaves, MD  CC:  Denies chest pain or shortness of breath..  History of Present Illness: Alexis Joseph is a very pleasant 70 year old woman, patient of Dr. 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  Her blood pressure climbed after holding clonidine and she has been taking this medication again. In fact she has been running out and has not been taking it twice a day or 3 times a day as indicated. Her blood pressures have been climbing to systolics in the 180s over the past week. She has indicated she would like a new prescription for clonidine and she is running out. otherwise she feels well. Her edema has significantly improved  EKG shows normal sinus rhythm with rate 93 beats per minute no significant ST or T wave changes    Current Medications (verified): 1)  Levemir Flexpen 100 Unit/ml Soln (Insulin Detemir) .... As Directed 2)  Apidra Solostar 100 Unit/ml Soln (Insulin Glulisine) .... As Directed 3)  Align  Caps (Probiotic Product) .Marland Kitchen.. 1 By Mouth Once Daily 4)  Tandem 162-115.2 Mg Caps (Ferrous Fum-Iron Polysacch) .Marland Kitchen.. 1 By Mouth Two Times A Day 5)  Plavix 75 Mg Tabs (Clopidogrel Bisulfate) .... Take One Tablet By Mouth Daily 6)  Clonidine Hcl 0.1 Mg Tabs (Clonidine Hcl) .Marland Kitchen.. 1 1/2  Tablet By Mouth Three Times A Day 7)  Aspirin 81 Mg Tbec (Aspirin) .... Take One Tablet By Mouth Daily 8)  Hydralazine Hcl 10 Mg Tabs (Hydralazine Hcl) .Marland Kitchen.. 1 By Mouth Three Times A Day 9)  Aciphex  20 Mg Tbec (Rabeprazole Sodium) .Marland Kitchen.. 1 By Mouth Two Times A Day 10)  Lyrica 75 Mg Caps (Pregabalin) .Marland Kitchen.. 1 By Mouth Two Times A Day 11)  Pravastatin Sodium 40 Mg Tabs (Pravastatin Sodium) .... Take One Tablet By Mouth Daily At Bedtime 12)  Co Q-10 100 Mg Caps (Coenzyme Q10) .Marland Kitchen.. 1 By Mouth Once Daily 13)  Omega-3 Fish Oil 1200 Mg Caps (Omega-3 Fatty Acids) .Marland Kitchen.. 1 By Mouth Once Daily 14)  Vitamin D 1000 Unit Tabs (Cholecalciferol) .Marland Kitchen.. 1 By Mouth Once Daily 15)  Kayexalate  Powd (Sodium Polystyrene Sulfonate) .... Take 30 Grams By Mouth X 2 Days 16)  Hydralazine Hcl 10 Mg Tabs (Hydralazine Hcl) .... Take One Tablet By Mouth Three Times A Day 17)  Tekturna 150 Mg Tabs (Aliskiren Fumarate) .... Take One Tablet By Mouth Daily  Allergies (verified): 1)  ! Cipro 2)  ! Amoxicillin 3)  ! Phenergan  Past History:  Past Medical History: Last updated: 12/20/2009 Edema Hypertension Hyperlipidemia Nephropathy History of TIA x 2 Mild to moderate mitral and tricupsid regurgitation Diastolic relaxation abnormality Mild pulmonary hypertension Diabetes  Past Surgical History: Last updated: 12/31/2009 gallbladder Abdominal Hysterectomy-Total Tonsillectomy  Family History: Last updated: 12/31/2009 Father: living 63: healthy, altimizers Mother: deceased 2: MI  Social History: Last updated: 12/31/2009 Retired  Married  Tobacco Use - No.  Alcohol Use - no Regular Exercise - yes Drug Use - no  Risk Factors: Alcohol Use: 0 (12/31/2009) Caffeine Use: 1-2 (12/31/2009) Exercise: yes (12/31/2009)  Risk Factors: Smoking Status: never (12/31/2009)  Review of Systems  The patient denies fever, weight loss, weight gain, vision loss, decreased hearing, hoarseness, chest pain, syncope, dyspnea on exertion, peripheral edema, prolonged cough, abdominal pain, incontinence, muscle weakness, depression, and enlarged lymph nodes.    Vital Signs:  Patient profile:   70 year old female Height:       63 inches Weight:      188 pounds BMI:     33.42 Pulse rate:   93 / minute BP sitting:   180 / 94  (left arm) Cuff size:   large  Vitals Entered By: Bishop Dublin, CMA (October 07, 2010 3:57 PM)  Physical Exam  General:  Well developed, well nourished, in no acute distress. Head:  normocephalic and atraumatic Neck:  Neck supple, no JVD. No masses, thyromegaly or abnormal cervical nodes. Lungs:  Clear bilaterally to auscultation and percussion. Heart:  Non-displaced PMI, chest non-tender; regular rate and rhythm, S1, S2 without murmurs, rubs or gallops. Carotid upstroke normal, no bruit.  Pedals normal pulses. No edema, no varicosities. Msk:  Back normal, normal gait. Muscle strength and tone normal. Pulses:  pulses normal in all 4 extremities Extremities:  No clubbing or cyanosis. Neurologic:  Alert and oriented x 3. Skin:  Intact without lesions or rashes. Psych:  Normal affect.   Impression & Recommendations:  Problem # 1:  HYPERTENSION, BENIGN (ICD-401.1) blood pressure is elevated which I suspect is secondary to her running out of her clonidine and having rebound hypertension. She has been taking it once a day as she is running out over the past week. We have suggested that she take clonidine 0.2 mg b.i.d.. She can take an extra half dose at lunchtime if needed for hypertension. If her pressure continues to be elevated, she can take hydralazine 25 mg p.r.n..  Her updated medication list for this problem includes:    Clonidine Hcl 0.2 Mg Tabs (Clonidine hcl) .Marland Kitchen... Take one tablet by mouth three times a day    Aspirin 81 Mg Tbec (Aspirin) .Marland Kitchen... Take one tablet by mouth daily    Hydralazine Hcl 25 Mg Tabs (Hydralazine hcl) .Marland Kitchen... Take one tablet by mouth three times a day    Tekturna 150 Mg Tabs (Aliskiren fumarate) .Marland Kitchen... Take one tablet by mouth daily  Problem # 2:  EDEMA (ICD-782.3) Edema appears well controlled on today's visit. Typically when her edema is well  controlled, her creatinine might be elevated. This is monitored by her renal physician.  Problem # 3:  MITRAL REGURGITATION (ICD-396.3) No significant symptoms of shortness of breath with exertion. No further testing needed Orders: EKG w/ Interpretation (93000)  Patient Instructions: 1)  Your physician recommends that you schedule a follow-up appointment in: 6 months 2)  Your physician has recommended you make the following change in your medication: INCREASE Clonidine 0.2 three times a day. INCREASE Hydralazine 25mg  three times a day. Prescriptions: HYDRALAZINE HCL 25 MG TABS (HYDRALAZINE HCL) Take one tablet by mouth three times a day  #90 x 12   Entered by:   Lanny Hurst RN   Authorized by:   Dossie Arbour MD   Signed by:   Lanny Hurst RN on 10/07/2010   Method used:   Electronically to        [facility] [phone]* (retail)       [address]       [place], [place]  [address]       Ph:  [phone] or [phone]       Fax: [phone]   RxID:   [id number] CLONIDINE HCL 0.2 MG TABS (CLONIDINE HCL) Take one tablet by mouth three times a day  #90 x 12   Entered by:   Lanny Hurst RN   Authorized by:   Dossie Arbour MD   Signed by:   Lanny Hurst RN on 10/07/2010   Method used:   Electronically to        [facility] [phone]* (retail)       [address] [place], [place]  [address]       Ph: [phone] or [phone]       Fax: [phone]   RxID:   [id number]

## 2010-12-16 NOTE — Letter (Signed)
Summary: [facility]: Follow Up Note  [facility]: Follow Up Note   Imported By: Earl Many 12/10/2010 10:31:42  _____________________________________________________________________  External Attachment:    Type:   Image     Comment:   External Document

## 2010-12-18 ENCOUNTER — Ambulatory Visit: Payer: Self-pay | Admitting: Internal Medicine

## 2011-01-18 ENCOUNTER — Ambulatory Visit: Payer: Self-pay | Admitting: Internal Medicine

## 2011-03-06 ENCOUNTER — Ambulatory Visit: Payer: Self-pay | Admitting: Internal Medicine

## 2011-03-20 ENCOUNTER — Ambulatory Visit: Payer: Self-pay | Admitting: Internal Medicine

## 2011-04-19 ENCOUNTER — Ambulatory Visit: Payer: Self-pay | Admitting: Internal Medicine

## 2011-05-12 ENCOUNTER — Encounter: Payer: Self-pay | Admitting: Cardiovascular Disease

## 2011-06-15 ENCOUNTER — Ambulatory Visit: Payer: Self-pay | Admitting: Internal Medicine

## 2011-06-20 ENCOUNTER — Ambulatory Visit: Payer: Self-pay | Admitting: Internal Medicine

## 2011-08-31 ENCOUNTER — Other Ambulatory Visit: Payer: Self-pay

## 2011-08-31 MED ORDER — ALISKIREN FUMARATE 150 MG PO TABS: 150.0000 mg | ORAL_TABLET | Freq: Every day | ORAL | Status: DC

## 2011-08-31 MED ORDER — HYDRALAZINE HCL 10 MG PO TABS: 10.0000 mg | ORAL_TABLET | Freq: Three times a day (TID) | ORAL | Status: DC

## 2011-08-31 MED ORDER — CLONIDINE HCL 0.2 MG PO TABS: 0.2000 mg | ORAL_TABLET | Freq: Three times a day (TID) | ORAL | Status: DC

## 2011-09-03 ENCOUNTER — Telehealth: Payer: Self-pay

## 2011-09-03 NOTE — Telephone Encounter (Signed)
Refill for clonidine 0.2 mg, tekturna 150 mg daily and hydralazine.

## 2011-09-09 IMAGING — CR DG ABDOMEN 3V
1 series · 5 of 5 positions shown · non-contrast
Comparison: none

REASON FOR EXAM: abd pain, shortness of breath
COMMENTS:

[Series 1: view not recorded · 0.17mm/px · 5 of 5 slices shown]
[im 1/5]
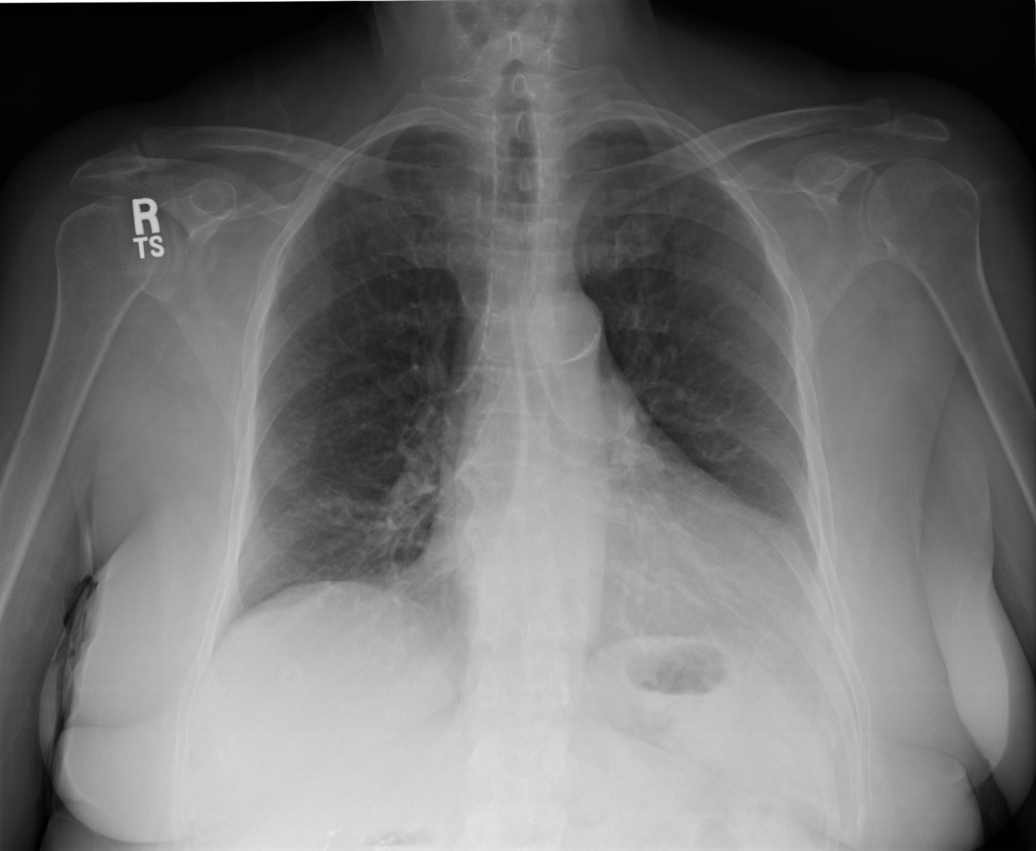
[im 2/5]
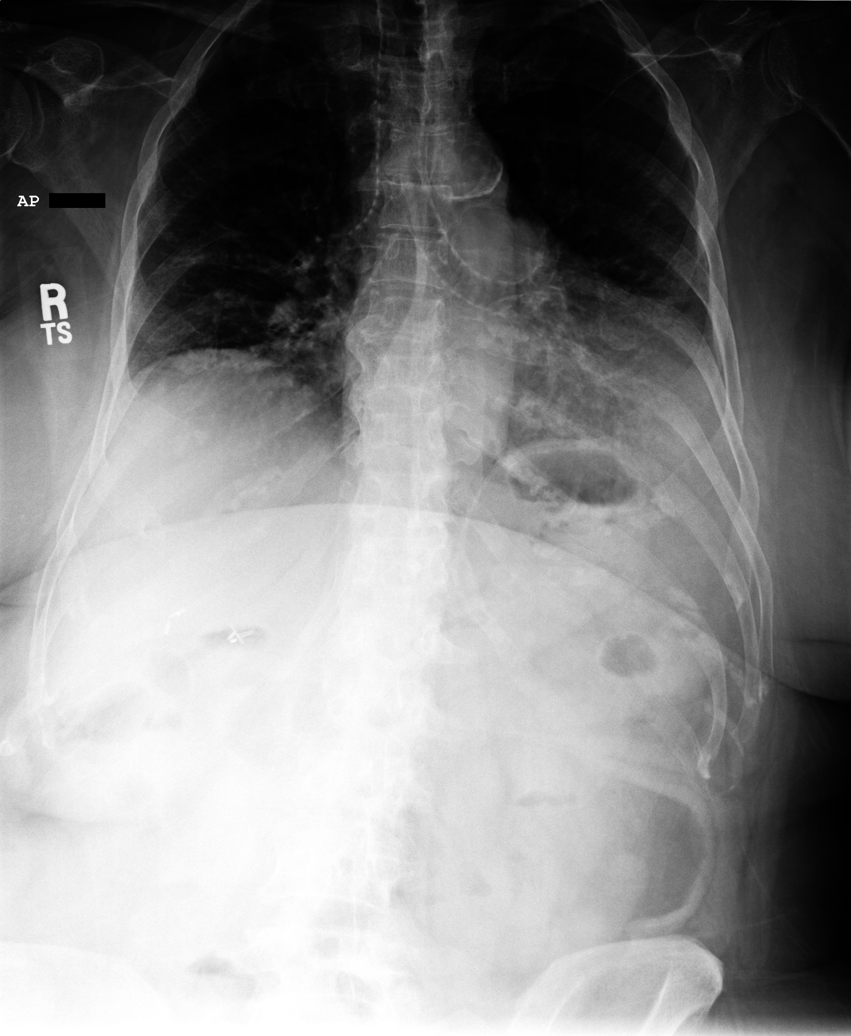
[im 3/5]
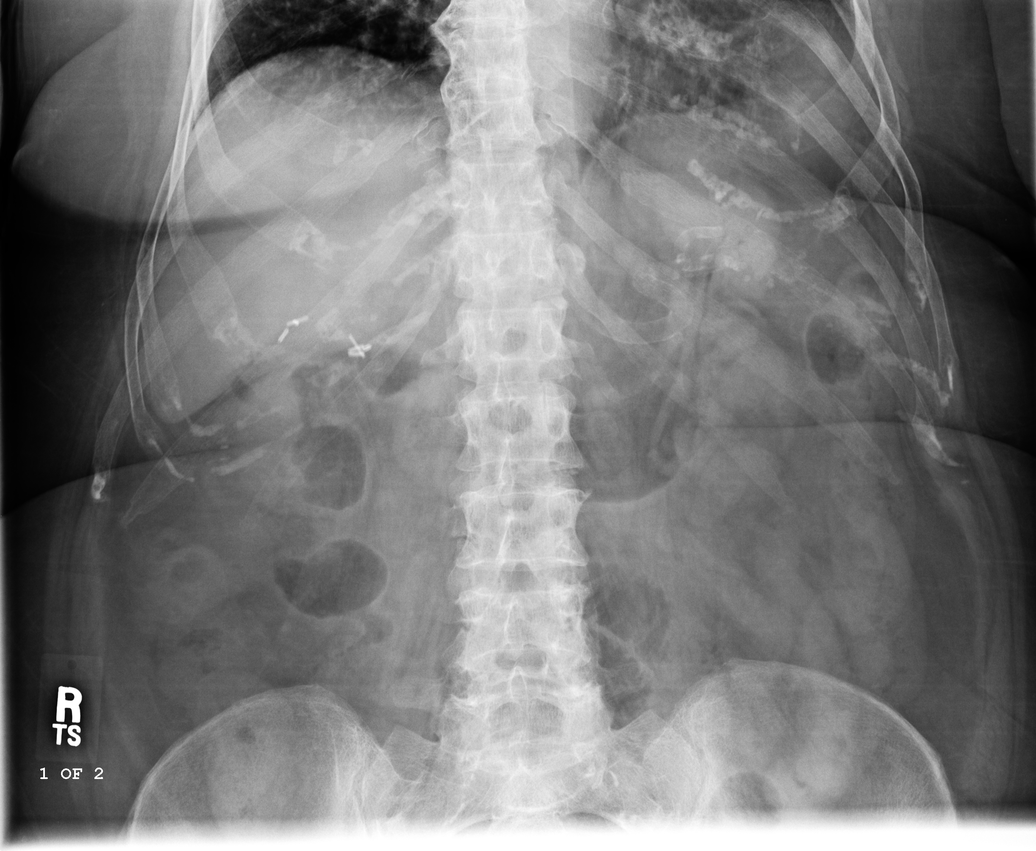
[im 4/5]
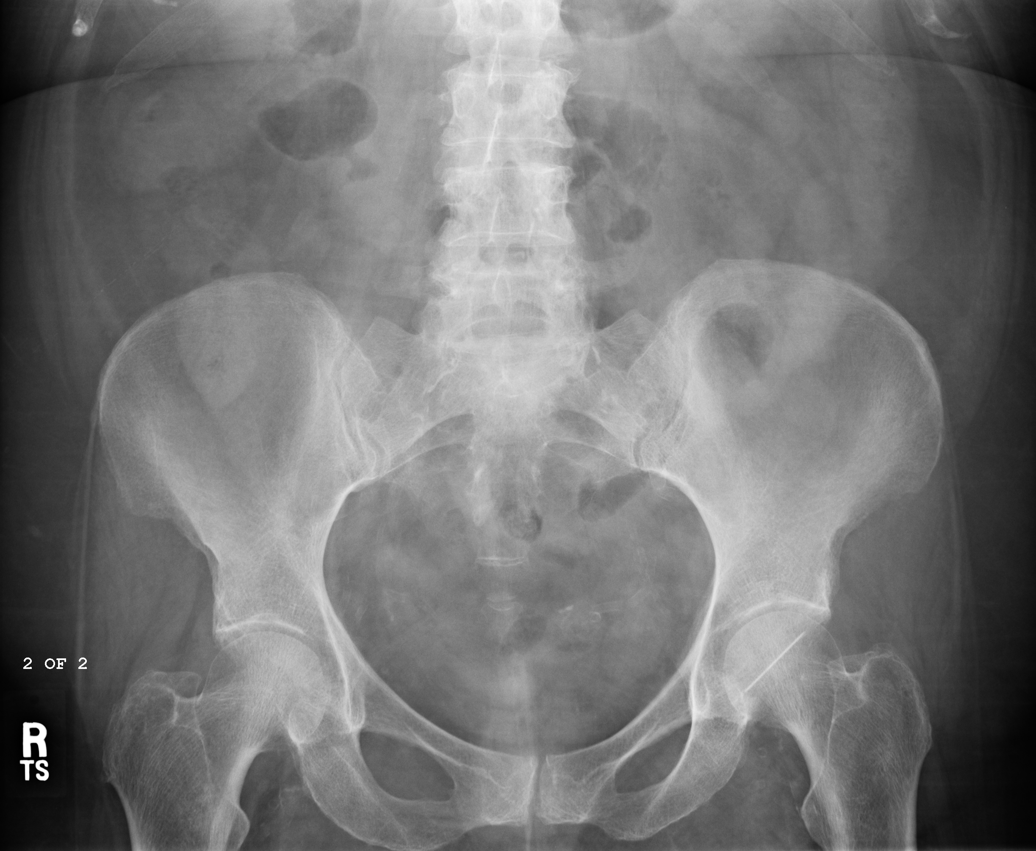
[im 5/5]
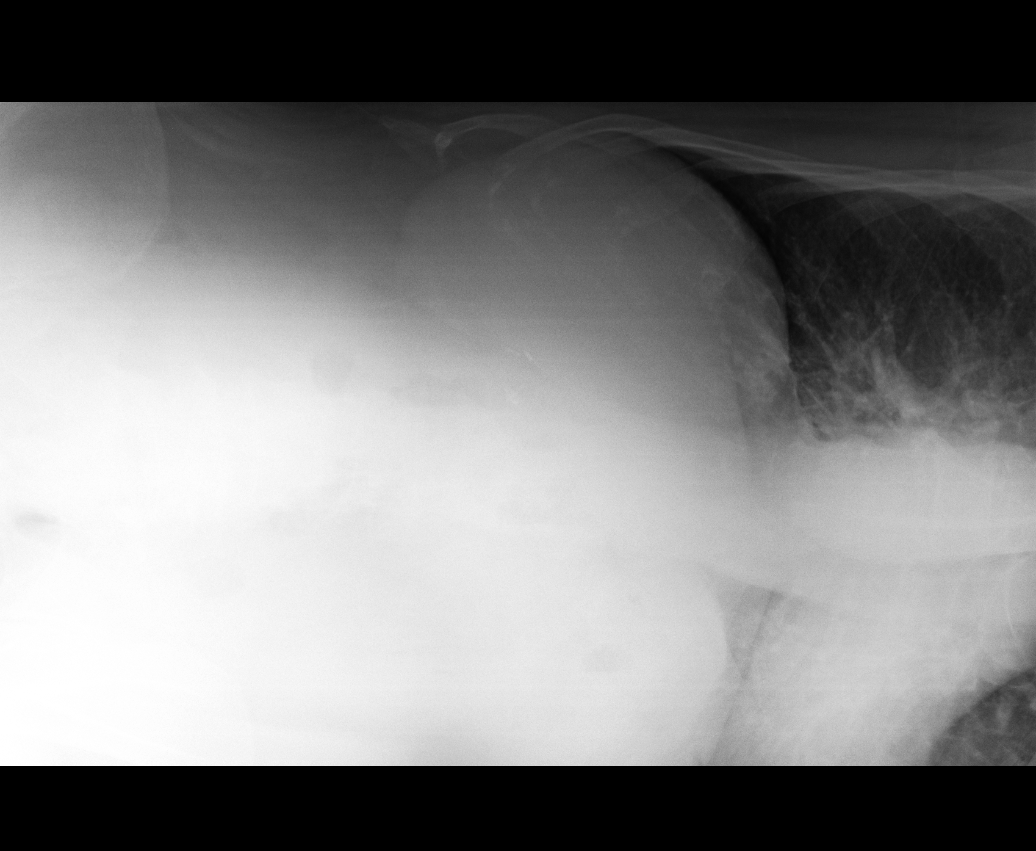

[5 of 5 positions shown; findings below may reference images not displayed]

PROCEDURE:     DXR - DXR ABDOMEN 3-WAY (INCL PA CXR)  - November 03, 2010 [DATE]

RESULT:     Comparison is made to the study of 12/01/2007.

Atherosclerotic calcification is present in the aortic arch. There is a
patchy band of density at the right lung base which is unchanged and likely
secondary to fibrosis. Minimal basilar atelectasis or infiltrate may be
present on the left where there appears to be some consolidation. The
abdominal bowel gas pattern appears normal. Cholecystectomy clips are
present. There is no evidence of abnormal bowel distention. There is some
air within loops of small bowel in the right mid abdomen and mid abdominal
region without gastric distention or free air.
IMPRESSION: 1. Increased density at the left lung base. Correlate for underlying
pneumonia with PA and lateral view chest x-ray.
2. Nonspecific bowel gas pattern.

## 2011-09-21 ENCOUNTER — Encounter: Payer: Self-pay | Admitting: Cardiovascular Disease

## 2011-09-22 ENCOUNTER — Ambulatory Visit: Payer: Self-pay | Admitting: Cardiovascular Disease

## 2011-10-05 ENCOUNTER — Ambulatory Visit: Payer: Self-pay | Admitting: Unknown Physician Specialty

## 2011-10-07 ENCOUNTER — Encounter: Payer: Self-pay | Admitting: Cardiovascular Disease

## 2011-10-07 ENCOUNTER — Ambulatory Visit (INDEPENDENT_AMBULATORY_CARE_PROVIDER_SITE_OTHER): Payer: Medicare Other | Admitting: Cardiovascular Disease

## 2011-10-07 VITALS — BP 100/60 | HR 70 | Ht 63.0 in | Wt 192.0 lb

## 2011-10-07 DIAGNOSIS — I10 Essential (primary) hypertension: Secondary | ICD-10-CM

## 2011-10-07 DIAGNOSIS — E785 Hyperlipidemia, unspecified: Secondary | ICD-10-CM

## 2011-10-07 DIAGNOSIS — R609 Edema, unspecified: Secondary | ICD-10-CM

## 2011-10-07 DIAGNOSIS — N289 Disorder of kidney and ureter, unspecified: Secondary | ICD-10-CM

## 2011-10-07 DIAGNOSIS — E119 Type 2 diabetes mellitus without complications: Secondary | ICD-10-CM

## 2011-10-07 NOTE — Assessment & Plan Note (Signed)
Cholesterol is at goal on the current lipid regimen. No changes to the medications were made.  

## 2011-10-07 NOTE — Assessment & Plan Note (Signed)
Renal insufficiency, baseline creatinine greater than 2. This appears to be stable I her recent lab work. Suspect secondary to long-standing diabetes and hypertension.

## 2011-10-07 NOTE — Assessment & Plan Note (Signed)
Edema is significantly improved. She is not on a diuretic.

## 2011-10-07 NOTE — Patient Instructions (Addendum)
You are doing well. Please hold the hydralazine as your blood pressure is low. Monitor your blood pressure as home and take hydralazine as needed for high blood pressure  Please call us if you have new issues that need to be addressed before your next appt.  Your physician wants you to follow-up in: 12 months.  You will receive a reminder letter in the mail two months in advance. If you don't receive a letter, please call our office to schedule the follow-up appointment.  The phone number for the [facility] primary care office is [phone]

## 2011-10-07 NOTE — Assessment & Plan Note (Signed)
Hemoglobin A1c is mildly elevated. We have suggested she continue to work on her diet and weight loss.

## 2011-10-07 NOTE — Progress Notes (Signed)
Patient ID: Alexis Joseph, female    DOB: 05-12-1941, 70 y.o.   MRN: [id number]  HPI Comments: Alexis Joseph is a very pleasant 70 year old woman with a history of chronic renal insufficiency, diabetes, hypertension, hyperlipidemia, TIA x2 with mild to moderate mitral and tricuspid regurgitation, diastolic relaxation abnormality, mild pulmonary hypertension with right ventricular systolic pressures of 43 based on echocardiography in December 2008, with dizziness on standing on previous visits, that seemed to improve with holding clonidine and lasix temorarily, Who presents for Routine followup.   In the past, she has held clonidine though had significant hypertension. She is back on clonidine and feels well. She does have occasional episodes of dizziness. She does report having systolic pressures sometimes in the 80s. Her edema has been well controlled. She does not take a diuretic.   Lab work from September 2012 shows total cholesterol 137, LDL 82, HDL 33, hemoglobin A1c 7.9, normal LFTs, creatinine 2.6, BUN 50   EKG shows normal sinus rhythm with rate 70 beats per minute no significant ST or T wave changes      Outpatient Encounter Prescriptions as of 10/07/2011  Medication Sig Dispense Refill  . aliskiren (TEKTURNA) 150 MG tablet Take 1 tablet (150 mg total) by mouth daily.  90 tablet  3  . ALPRAZolam (XANAX) 0.25 MG tablet Take 0.25 mg by mouth at bedtime as needed.        Marland Kitchen aspirin 81 MG EC tablet Take 81 mg by mouth daily.        . cholecalciferol (VITAMIN D) 1000 UNITS tablet Take 1,000 Units by mouth daily.        . citalopram (CELEXA) 20 MG tablet Take 20 mg by mouth daily.        . cloNIDine (CATAPRES) 0.2 MG tablet Take 1 tablet (0.2 mg total) by mouth 3 (three) times daily.  270 tablet  3  . clopidogrel (PLAVIX) 75 MG tablet Take 75 mg by mouth daily.        . Coenzyme Q10 (CO Q-10) 100 MG CAPS Take 1 capsule by mouth daily.        Marland Kitchen dexlansoprazole (DEXILANT) 60 MG capsule Take 60  mg by mouth daily.        . ferrous fumarate-iron polysaccharide complex (TANDEM) 162-115.2 MG CAPS Take 1 capsule by mouth 2 (two) times daily.        . hydrALAZINE (APRESOLINE) 25 MG tablet Take 1 tablet (25 mg total) by mouth 3 (three) times daily.  270 tablet  3  . insulin detemir (LEVEMIR) 100 UNIT/ML injection Inject into the skin as directed.        . insulin glulisine (APIDRA) 100 UNIT/ML injection Inject into the skin as directed.        . pravastatin (PRAVACHOL) 40 MG tablet Take 40 mg by mouth at bedtime.        . pregabalin (LYRICA) 75 MG capsule Take 75 mg by mouth 2 (two) times daily.           Review of Systems  Constitutional: Negative.   HENT: Negative.   Eyes: Negative.   Respiratory: Negative.   Cardiovascular: Negative.   Gastrointestinal: Negative.   Musculoskeletal: Negative.   Skin: Negative.   Neurological: Negative.        Rare dizziness  Hematological: Negative.   Psychiatric/Behavioral: Negative.   All other systems reviewed and are negative.     BP 100/60  Pulse 70  Ht 5\' 3"  (1.6 m)  Wt 192  lb (87.091 kg)  BMI 34.01 kg/m2  Physical Exam  Nursing note and vitals reviewed. Constitutional: She is oriented to person, place, and time. She appears well-developed and well-nourished.  HENT:  Head: Normocephalic.  Nose: Nose normal.  Mouth/Throat: Oropharynx is clear and moist.  Eyes: Conjunctivae are normal. Pupils are equal, round, and reactive to light.  Neck: Normal range of motion. Neck supple. No JVD present.  Cardiovascular: Normal rate, regular rhythm, S1 normal, S2 normal, normal heart sounds and intact distal pulses.  Exam reveals no gallop and no friction rub.   No murmur heard. Pulmonary/Chest: Effort normal and breath sounds normal. No respiratory distress. She has no wheezes. She has no rales. She exhibits no tenderness.  Abdominal: Soft. Bowel sounds are normal. She exhibits no distension. There is no tenderness.  Musculoskeletal:  Normal range of motion. She exhibits no edema and no tenderness.  Lymphadenopathy:    She has no cervical adenopathy.  Neurological: She is alert and oriented to person, place, and time. Coordination normal.  Skin: Skin is warm and dry. No rash noted. No erythema.  Psychiatric: She has a normal mood and affect. Her behavior is normal. Judgment and thought content normal.         Assessment and Plan

## 2011-10-07 NOTE — Assessment & Plan Note (Signed)
Blood pressure appears to be low on today's visit. She has systolic pressures less than 100 at home. We have suggested she hold her hydralazine And take this only as needed for high blood pressures.

## 2011-10-26 ENCOUNTER — Telehealth: Payer: Self-pay

## 2011-10-26 MED ORDER — CLONIDINE HCL 0.2 MG PO TABS: 0.2000 mg | ORAL_TABLET | Freq: Three times a day (TID) | ORAL | Status: DC

## 2011-10-26 NOTE — Telephone Encounter (Signed)
Refill sent for clonidine hcl 0.2 mg take one tablet tid

## 2011-12-02 ENCOUNTER — Ambulatory Visit: Payer: Self-pay | Admitting: Internal Medicine

## 2011-12-02 LAB — CBC CANCER CENTER
Basophil #: 0 x10 3/mm (ref 0.0–0.1)
Basophil %: 0.4 %
Eosinophil #: 0.1 x10 3/mm (ref 0.0–0.7)
HCT: 35.2 % (ref 35.0–47.0)
Lymphocyte #: 2.2 x10 3/mm (ref 1.0–3.6)
Lymphocyte %: 27.9 %
MCH: 32.4 pg (ref 26.0–34.0)
MCHC: 33.2 g/dL (ref 32.0–36.0)
Neutrophil #: 4.9 x10 3/mm (ref 1.4–6.5)
RDW: 14.9 % — ABNORMAL HIGH (ref 11.5–14.5)

## 2011-12-18 ENCOUNTER — Ambulatory Visit: Payer: Self-pay | Admitting: Internal Medicine

## 2012-02-10 ENCOUNTER — Other Ambulatory Visit: Payer: Self-pay | Admitting: Cardiovascular Disease

## 2012-02-10 MED ORDER — CLONIDINE HCL 0.2 MG PO TABS: 0.2000 mg | ORAL_TABLET | Freq: Three times a day (TID) | ORAL | Status: AC

## 2012-02-10 NOTE — Telephone Encounter (Signed)
Refilled cloNIDine.

## 2012-08-02 ENCOUNTER — Inpatient Hospital Stay: Payer: Self-pay

## 2012-08-02 LAB — CBC WITH DIFFERENTIAL/PLATELET
Basophil #: 0 10*3/uL (ref 0.0–0.1)
Basophil %: 0.2 %
Eosinophil #: 0.1 10*3/uL (ref 0.0–0.7)
Lymphocyte #: 2.1 10*3/uL (ref 1.0–3.6)
Monocyte %: 7.6 %
Neutrophil #: 16.6 10*3/uL — ABNORMAL HIGH (ref 1.4–6.5)
RDW: 14.5 % (ref 11.5–14.5)
WBC: 20.3 10*3/uL — ABNORMAL HIGH (ref 3.6–11.0)

## 2012-08-02 LAB — COMPREHENSIVE METABOLIC PANEL
Albumin: 3.4 g/dL (ref 3.4–5.0)
Alkaline Phosphatase: 247 U/L — ABNORMAL HIGH (ref 50–136)
Anion Gap: 10 (ref 7–16)
BUN: 55 mg/dL — ABNORMAL HIGH (ref 7–18)
Creatinine: 2.67 mg/dL — ABNORMAL HIGH (ref 0.60–1.30)
EGFR (African American): 20 — ABNORMAL LOW
EGFR (Non-African Amer.): 17 — ABNORMAL LOW
Glucose: 207 mg/dL — ABNORMAL HIGH (ref 65–99)
Osmolality: 299 (ref 275–301)
SGPT (ALT): 26 U/L (ref 12–78)
Sodium: 139 mmol/L (ref 136–145)
Total Protein: 8.9 g/dL — ABNORMAL HIGH (ref 6.4–8.2)

## 2012-08-02 LAB — CK TOTAL AND CKMB (NOT AT ARMC)
CK, Total: 51 U/L (ref 21–215)
CK-MB: 0.8 ng/mL (ref 0.5–3.6)
CK-MB: 1.3 ng/mL (ref 0.5–3.6)

## 2012-08-02 LAB — URINALYSIS, COMPLETE
Bilirubin,UR: NEGATIVE
Glucose,UR: 50 mg/dL (ref 0–75)
Granular Cast: 3
Hyaline Cast: 10
Nitrite: NEGATIVE
Ph: 5 (ref 4.5–8.0)
Protein: 100
Specific Gravity: 1.016 (ref 1.003–1.030)

## 2012-08-02 LAB — TROPONIN I
Troponin-I: <0.02 ng/mL
Troponin-I: <0.02 ng/mL
Troponin-I: 0.02 ng/mL

## 2012-08-03 LAB — CBC WITH DIFFERENTIAL/PLATELET
Basophil #: 0 10*3/uL (ref 0.0–0.1)
Basophil %: 0.2 %
Eosinophil #: 0.2 10*3/uL (ref 0.0–0.7)
HGB: 10 g/dL — ABNORMAL LOW (ref 12.0–16.0)
Lymphocyte #: 3.5 10*3/uL (ref 1.0–3.6)
Lymphocyte %: 36.4 %
MCH: 32.8 pg (ref 26.0–34.0)
MCV: 99 fL (ref 80–100)
Monocyte #: 0.9 x10 3/mm (ref 0.2–0.9)
Monocyte %: 9.5 %
Neutrophil #: 5 10*3/uL (ref 1.4–6.5)
Neutrophil %: 52 %
Platelet: 194 10*3/uL (ref 150–440)
RBC: 3.05 10*6/uL — ABNORMAL LOW (ref 3.80–5.20)
RDW: 14.5 % (ref 11.5–14.5)

## 2012-08-03 LAB — COMPREHENSIVE METABOLIC PANEL
Albumin: 2.5 g/dL — ABNORMAL LOW (ref 3.4–5.0)
Alkaline Phosphatase: 155 U/L — ABNORMAL HIGH (ref 50–136)
Anion Gap: 9 (ref 7–16)
Calcium, Total: 8.2 mg/dL — ABNORMAL LOW (ref 8.5–10.1)
EGFR (African American): 22 — ABNORMAL LOW
EGFR (Non-African Amer.): 19 — ABNORMAL LOW
Glucose: 45 mg/dL — ABNORMAL LOW (ref 65–99)
SGOT(AST): 15 U/L (ref 15–37)
SGPT (ALT): 14 U/L (ref 12–78)
Total Protein: 6.4 g/dL (ref 6.4–8.2)

## 2012-08-03 LAB — MAGNESIUM: Magnesium: 1.8 mg/dL

## 2012-08-03 LAB — PROTIME-INR: INR: 1

## 2012-08-04 LAB — BASIC METABOLIC PANEL
Anion Gap: 8 (ref 7–16)
Calcium, Total: 8.5 mg/dL (ref 8.5–10.1)
Chloride: 113 mmol/L — ABNORMAL HIGH (ref 98–107)
Co2: 23 mmol/L (ref 21–32)
Creatinine: 2.2 mg/dL — ABNORMAL HIGH (ref 0.60–1.30)
EGFR (African American): 25 — ABNORMAL LOW
Osmolality: 293 (ref 275–301)

## 2012-08-04 LAB — CBC WITH DIFFERENTIAL/PLATELET
Eosinophil #: 0.2 10*3/uL (ref 0.0–0.7)
HCT: 30.3 % — ABNORMAL LOW (ref 35.0–47.0)
HGB: 10 g/dL — ABNORMAL LOW (ref 12.0–16.0)
MCHC: 33 g/dL (ref 32.0–36.0)
Monocyte #: 0.8 x10 3/mm (ref 0.2–0.9)
Neutrophil %: 61.1 %
Platelet: 192 10*3/uL (ref 150–440)
RBC: 3.05 10*6/uL — ABNORMAL LOW (ref 3.80–5.20)
WBC: 8.4 10*3/uL (ref 3.6–11.0)

## 2012-08-07 LAB — CULTURE, BLOOD (SINGLE)

## 2012-09-25 ENCOUNTER — Emergency Department: Payer: Self-pay | Admitting: Emergency Medicine

## 2012-09-25 LAB — URINALYSIS, COMPLETE
Bacteria: NONE SEEN
Bilirubin,UR: NEGATIVE
Blood: NEGATIVE
Glucose,UR: >=500 mg/dL (ref 0–75)
Leukocyte Esterase: NEGATIVE
Nitrite: NEGATIVE
RBC,UR: <1 /HPF (ref 0–5)
WBC UR: 5 /HPF (ref 0–5)

## 2012-09-25 LAB — COMPREHENSIVE METABOLIC PANEL
Anion Gap: 7 (ref 7–16)
BUN: 46 mg/dL — ABNORMAL HIGH (ref 7–18)
Bilirubin,Total: 0.4 mg/dL (ref 0.2–1.0)
Chloride: 105 mmol/L (ref 98–107)
Creatinine: 2.66 mg/dL — ABNORMAL HIGH (ref 0.60–1.30)
EGFR (African American): 20 — ABNORMAL LOW
EGFR (Non-African Amer.): 17 — ABNORMAL LOW
Osmolality: 280 (ref 275–301)
Potassium: 4.9 mmol/L (ref 3.5–5.1)
SGOT(AST): 30 U/L (ref 15–37)
Sodium: 134 mmol/L — ABNORMAL LOW (ref 136–145)
Total Protein: 6.7 g/dL (ref 6.4–8.2)

## 2012-09-25 LAB — CBC
HCT: 32.9 % — ABNORMAL LOW (ref 35.0–47.0)
HGB: 10.7 g/dL — ABNORMAL LOW (ref 12.0–16.0)
MCH: 31.9 pg (ref 26.0–34.0)
MCV: 98 fL (ref 80–100)
Platelet: 201 10*3/uL (ref 150–440)
RBC: 3.36 10*6/uL — ABNORMAL LOW (ref 3.80–5.20)
RDW: 13.8 % (ref 11.5–14.5)
WBC: 8.6 10*3/uL (ref 3.6–11.0)

## 2012-09-25 LAB — APTT: Activated PTT: 25.1 secs (ref 23.6–35.9)

## 2012-09-25 LAB — PROTIME-INR: Prothrombin Time: 12.8 secs (ref 11.5–14.7)

## 2012-10-02 ENCOUNTER — Observation Stay: Payer: Self-pay | Admitting: Family Medicine

## 2012-10-02 LAB — COMPREHENSIVE METABOLIC PANEL
Anion Gap: 8 (ref 7–16)
Bilirubin,Total: 0.4 mg/dL (ref 0.2–1.0)
Calcium, Total: 8.8 mg/dL (ref 8.5–10.1)
Chloride: 104 mmol/L (ref 98–107)
Creatinine: 2.71 mg/dL — ABNORMAL HIGH (ref 0.60–1.30)
EGFR (African American): 19 — ABNORMAL LOW
Glucose: 161 mg/dL — ABNORMAL HIGH (ref 65–99)
Osmolality: 290 (ref 275–301)
Potassium: 4.2 mmol/L (ref 3.5–5.1)
SGPT (ALT): 15 U/L (ref 12–78)
Sodium: 138 mmol/L (ref 136–145)
Total Protein: 7.1 g/dL (ref 6.4–8.2)

## 2012-10-02 LAB — CBC
HCT: 36.8 %
HGB: 12.3 g/dL
MCH: 32 pg
MCHC: 33.5 g/dL
MCV: 96 fL
Platelet: 223 10*3/uL
RBC: 3.84 X10 6/mm 3
RDW: 13.8 %
WBC: 10.6 10*3/uL

## 2012-10-02 LAB — TROPONIN I: Troponin-I: <0.02 ng/mL

## 2012-10-03 LAB — CBC WITH DIFFERENTIAL/PLATELET
Basophil %: 0.3 %
Eosinophil #: 0.1 10*3/uL (ref 0.0–0.7)
Eosinophil %: 0.8 %
Lymphocyte %: 31.8 %
MCH: 34.4 pg — ABNORMAL HIGH (ref 26.0–34.0)
Monocyte %: 9.5 %
Neutrophil %: 57.6 %
Platelet: 198 10*3/uL (ref 150–440)
RBC: 3.46 10*6/uL — ABNORMAL LOW (ref 3.80–5.20)
RDW: 13.7 % (ref 11.5–14.5)
WBC: 9.4 10*3/uL (ref 3.6–11.0)

## 2012-10-03 LAB — BASIC METABOLIC PANEL
Anion Gap: 6 — ABNORMAL LOW (ref 7–16)
Calcium, Total: 8.4 mg/dL — ABNORMAL LOW (ref 8.5–10.1)
Chloride: 111 mmol/L — ABNORMAL HIGH (ref 98–107)
Co2: 25 mmol/L (ref 21–32)
Creatinine: 2.83 mg/dL — ABNORMAL HIGH (ref 0.60–1.30)
EGFR (African American): 18 — ABNORMAL LOW
Glucose: 205 mg/dL — ABNORMAL HIGH (ref 65–99)
Osmolality: 301 (ref 275–301)

## 2012-10-04 ENCOUNTER — Encounter: Payer: Self-pay | Admitting: Internal Medicine

## 2012-10-04 LAB — BASIC METABOLIC PANEL
BUN: 46 mg/dL — ABNORMAL HIGH (ref 7–18)
Chloride: 108 mmol/L — ABNORMAL HIGH (ref 98–107)
Creatinine: 2.69 mg/dL — ABNORMAL HIGH (ref 0.60–1.30)
EGFR (Non-African Amer.): 17 — ABNORMAL LOW
Osmolality: 289 (ref 275–301)
Potassium: 4.9 mmol/L (ref 3.5–5.1)
Sodium: 139 mmol/L (ref 136–145)

## 2012-10-11 LAB — URINALYSIS, COMPLETE
Bilirubin,UR: NEGATIVE
Glucose,UR: 50 mg/dL (ref 0–75)
Leukocyte Esterase: NEGATIVE
Nitrite: NEGATIVE
Ph: 5 (ref 4.5–8.0)
RBC,UR: 1 /HPF (ref 0–5)
Squamous Epithelial: 9
WBC UR: 10 /HPF (ref 0–5)

## 2012-10-13 LAB — URINE CULTURE

## 2012-10-19 ENCOUNTER — Encounter: Payer: Self-pay | Admitting: Internal Medicine

## 2012-11-04 ENCOUNTER — Encounter: Payer: Medicare Other | Admitting: Cardiovascular Disease

## 2012-11-04 ENCOUNTER — Encounter: Payer: Self-pay | Admitting: Cardiovascular Disease

## 2012-11-07 ENCOUNTER — Encounter: Payer: Self-pay | Admitting: *Deleted

## 2012-11-19 ENCOUNTER — Ambulatory Visit: Payer: Self-pay | Admitting: Internal Medicine

## 2012-12-10 ENCOUNTER — Inpatient Hospital Stay: Payer: Self-pay

## 2012-12-10 LAB — COMPREHENSIVE METABOLIC PANEL
Albumin: 2.8 g/dL — ABNORMAL LOW (ref 3.4–5.0)
Alkaline Phosphatase: 196 U/L — ABNORMAL HIGH (ref 50–136)
Anion Gap: 13 (ref 7–16)
Calcium, Total: 8.8 mg/dL (ref 8.5–10.1)
Chloride: 107 mmol/L (ref 98–107)
EGFR (Non-African Amer.): 12 — ABNORMAL LOW
Osmolality: 304 (ref 275–301)
Potassium: 5 mmol/L (ref 3.5–5.1)
SGPT (ALT): 20 U/L (ref 12–78)
Sodium: 138 mmol/L (ref 136–145)
Total Protein: 7.4 g/dL (ref 6.4–8.2)

## 2012-12-10 LAB — CBC
HGB: 11.9 g/dL — ABNORMAL LOW (ref 12.0–16.0)
MCH: 30.7 pg (ref 26.0–34.0)
MCHC: 32.3 g/dL (ref 32.0–36.0)
MCV: 95 fL (ref 80–100)
RBC: 3.86 10*6/uL (ref 3.80–5.20)
RDW: 14.6 % — ABNORMAL HIGH (ref 11.5–14.5)
WBC: 18.2 10*3/uL — ABNORMAL HIGH (ref 3.6–11.0)

## 2012-12-10 LAB — LIPASE, BLOOD: Lipase: 211 U/L (ref 73–393)

## 2012-12-11 LAB — URINALYSIS, COMPLETE
Ketone: NEGATIVE
Nitrite: NEGATIVE
Protein: 100
RBC,UR: NONE SEEN /HPF (ref 0–5)

## 2012-12-11 LAB — CBC WITH DIFFERENTIAL/PLATELET
Basophil #: 0 10*3/uL (ref 0.0–0.1)
Basophil %: 0.2 %
Eosinophil %: 0.8 %
Lymphocyte #: 2.5 10*3/uL (ref 1.0–3.6)
Lymphocyte %: 26.5 %
MCH: 31.6 pg (ref 26.0–34.0)
MCHC: 33.4 g/dL (ref 32.0–36.0)
MCV: 95 fL (ref 80–100)
Monocyte #: 0.9 x10 3/mm (ref 0.2–0.9)
Monocyte %: 9.5 %
Neutrophil #: 6.1 10*3/uL (ref 1.4–6.5)
Neutrophil %: 63 %
Platelet: 217 10*3/uL (ref 150–440)
RDW: 14.5 % (ref 11.5–14.5)
WBC: 9.6 10*3/uL (ref 3.6–11.0)

## 2012-12-11 LAB — BASIC METABOLIC PANEL
Anion Gap: 10 (ref 7–16)
Calcium, Total: 7.8 mg/dL — ABNORMAL LOW (ref 8.5–10.1)
Creatinine: 3.42 mg/dL — ABNORMAL HIGH (ref 0.60–1.30)
EGFR (African American): 15 — ABNORMAL LOW
Glucose: 90 mg/dL (ref 65–99)
Osmolality: 305 (ref 275–301)

## 2012-12-11 LAB — WBCS, STOOL

## 2012-12-11 LAB — CLOSTRIDIUM DIFFICILE BY PCR

## 2012-12-12 LAB — CBC WITH DIFFERENTIAL/PLATELET
Basophil %: 0.1 %
HCT: 30.3 % — ABNORMAL LOW (ref 35.0–47.0)
HGB: 10 g/dL — ABNORMAL LOW (ref 12.0–16.0)
Lymphocyte #: 2.1 10*3/uL (ref 1.0–3.6)
MCH: 31.4 pg (ref 26.0–34.0)
Monocyte %: 8.7 %
Neutrophil #: 5.2 10*3/uL (ref 1.4–6.5)
Platelet: 232 10*3/uL (ref 150–440)
RBC: 3.2 10*6/uL — ABNORMAL LOW (ref 3.80–5.20)
WBC: 8.1 10*3/uL (ref 3.6–11.0)

## 2012-12-12 LAB — COMPREHENSIVE METABOLIC PANEL
Albumin: 2.4 g/dL — ABNORMAL LOW (ref 3.4–5.0)
Alkaline Phosphatase: 189 U/L — ABNORMAL HIGH (ref 50–136)
Anion Gap: 9 (ref 7–16)
BUN: 49 mg/dL — ABNORMAL HIGH (ref 7–18)
Bilirubin,Total: 0.4 mg/dL (ref 0.2–1.0)
Calcium, Total: 8.4 mg/dL — ABNORMAL LOW (ref 8.5–10.1)
Chloride: 118 mmol/L — ABNORMAL HIGH (ref 98–107)
Co2: 17 mmol/L — ABNORMAL LOW (ref 21–32)
Creatinine: 3.05 mg/dL — ABNORMAL HIGH (ref 0.60–1.30)
Osmolality: 299 (ref 275–301)
Potassium: 4.5 mmol/L (ref 3.5–5.1)
SGOT(AST): 69 U/L — ABNORMAL HIGH (ref 15–37)
SGPT (ALT): 61 U/L (ref 12–78)
Total Protein: 6.2 g/dL — ABNORMAL LOW (ref 6.4–8.2)

## 2012-12-13 LAB — STOOL CULTURE

## 2013-05-03 ENCOUNTER — Ambulatory Visit: Payer: Self-pay | Admitting: Vascular Surgery

## 2013-05-03 DIAGNOSIS — I10 Essential (primary) hypertension: Secondary | ICD-10-CM

## 2013-05-03 LAB — BASIC METABOLIC PANEL
Anion Gap: 7 (ref 7–16)
BUN: 78 mg/dL — ABNORMAL HIGH (ref 7–18)
Calcium, Total: 8.8 mg/dL (ref 8.5–10.1)
Co2: 23 mmol/L (ref 21–32)
Creatinine: 3.6 mg/dL — ABNORMAL HIGH (ref 0.60–1.30)
EGFR (Non-African Amer.): 12 — ABNORMAL LOW
Glucose: 133 mg/dL — ABNORMAL HIGH (ref 65–99)
Osmolality: 305 (ref 275–301)
Sodium: 140 mmol/L (ref 136–145)

## 2013-05-03 LAB — CBC
HCT: 32.8 % — ABNORMAL LOW (ref 35.0–47.0)
MCH: 31.5 pg (ref 26.0–34.0)
MCHC: 34.1 g/dL (ref 32.0–36.0)
Platelet: 250 10*3/uL (ref 150–440)
RBC: 3.55 10*6/uL — ABNORMAL LOW (ref 3.80–5.20)
RDW: 13.9 % (ref 11.5–14.5)

## 2013-05-10 ENCOUNTER — Ambulatory Visit: Payer: Self-pay | Admitting: Vascular Surgery

## 2013-05-10 LAB — POTASSIUM: Potassium: 4.9 mmol/L (ref 3.5–5.1)

## 2013-10-16 IMAGING — CT CT ABD-PELV W/O CM
1 of 2 series · 15 of 32 positions shown, 19 images · non-contrast
Comparison: none

REASON FOR EXAM: (1) LLQ ain; (2) LLQ pain;    NOTE: Nursing to Give Oral
CT Contrast
COMMENTS:

[Series 2: 3mm soft tissue · axial · 0.73mm/px · z∈[-1198,-790]mm · 15 of 150 slices shown, 19 images]
[im 7/150  soft-tissue]
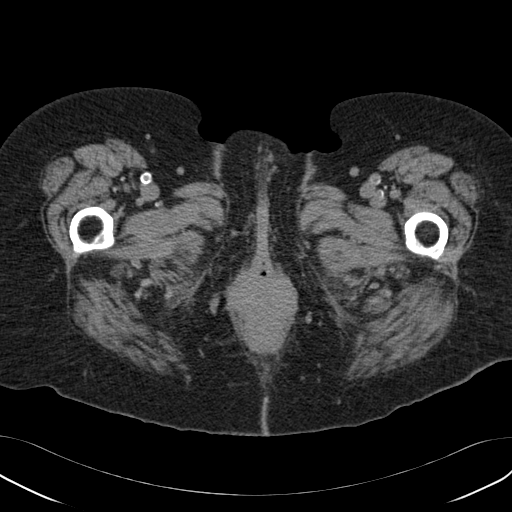
[im 7/150  bone]
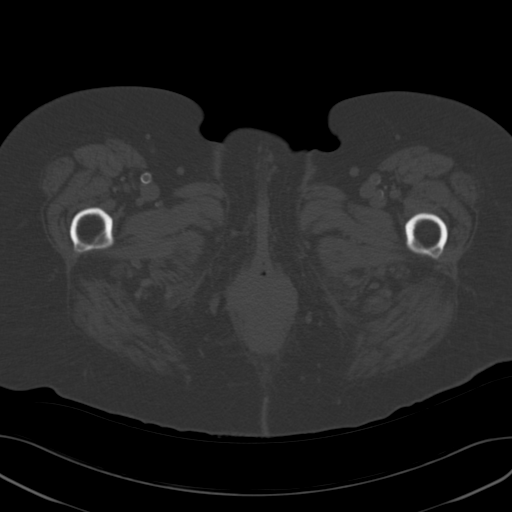
[im 19/150  soft-tissue]
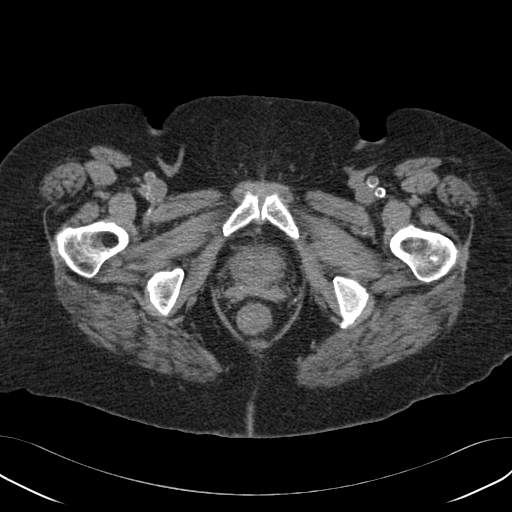
[im 32/150  soft-tissue]
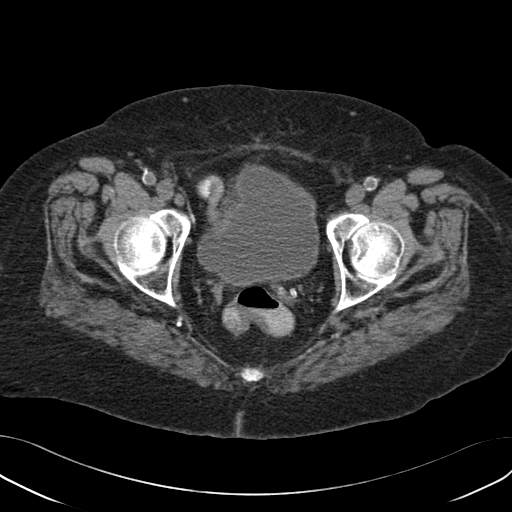
[im 44/150  soft-tissue]
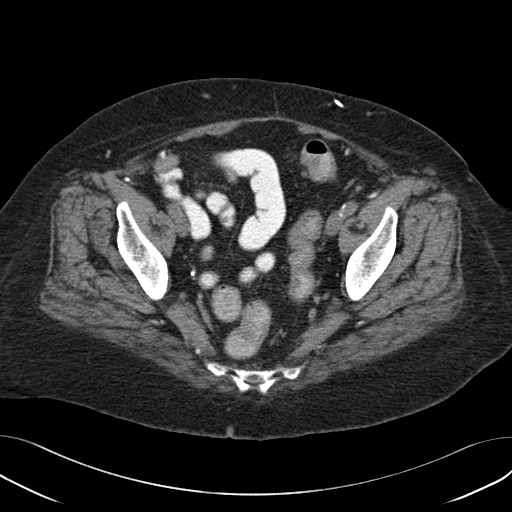
[im 50/150  soft-tissue]
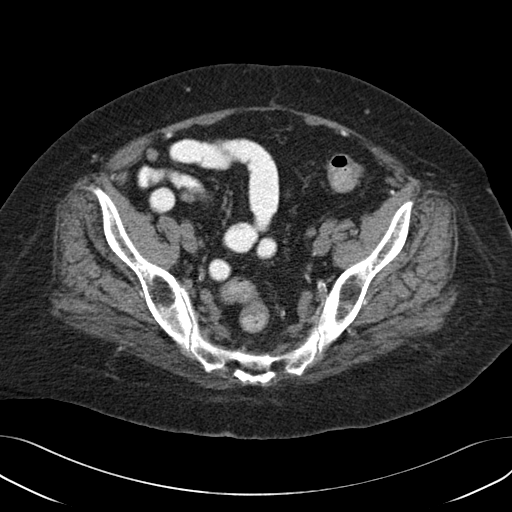
[im 63/150  soft-tissue]
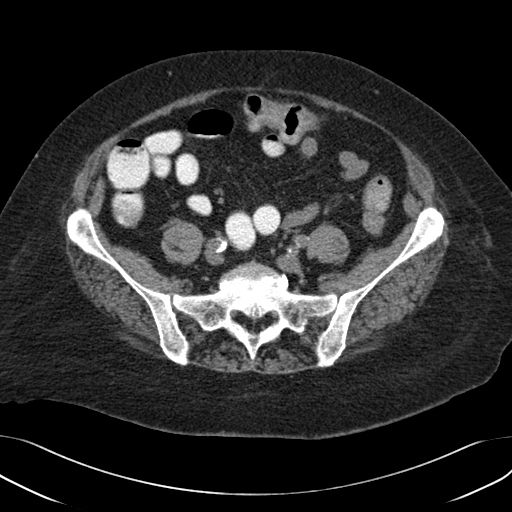
[im 75/150  soft-tissue]
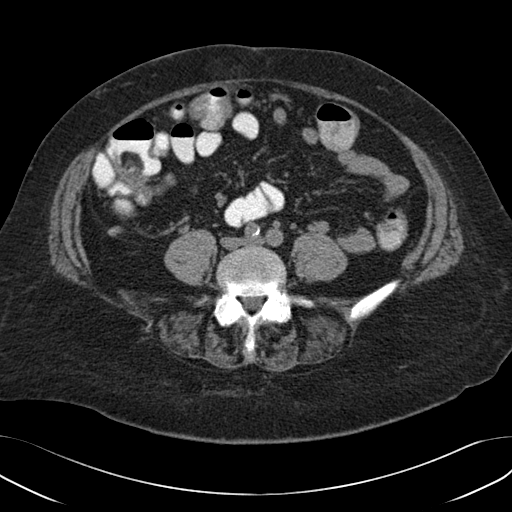
[im 87/150  soft-tissue]
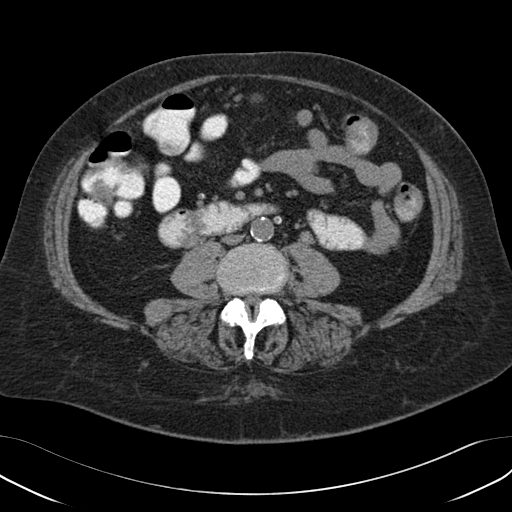
[im 100/150  soft-tissue]
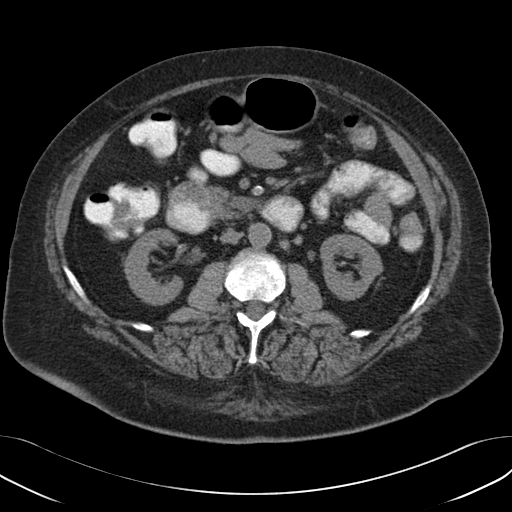
[im 100/150  bone]
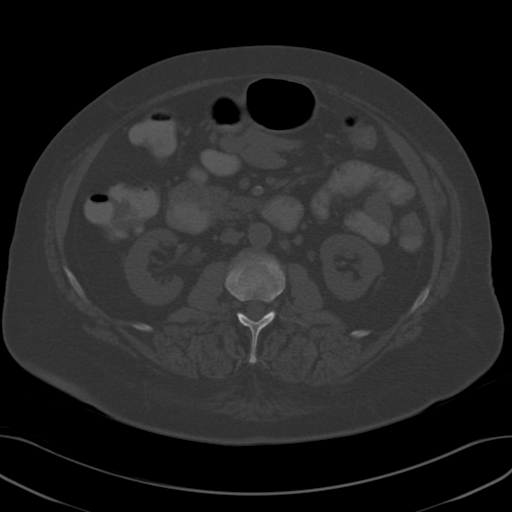
[im 106/150  soft-tissue]
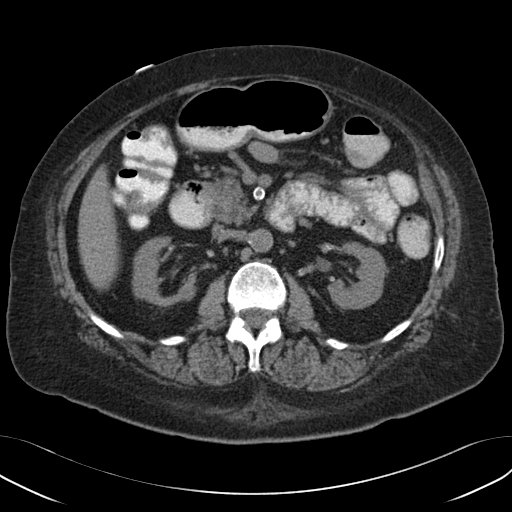
[im 118/150  soft-tissue]
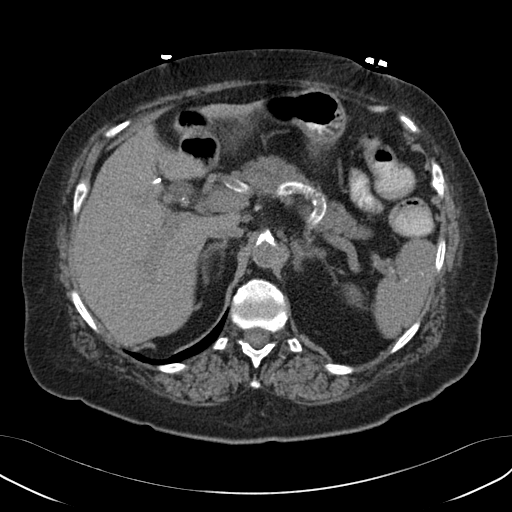
[im 125/150  lung]
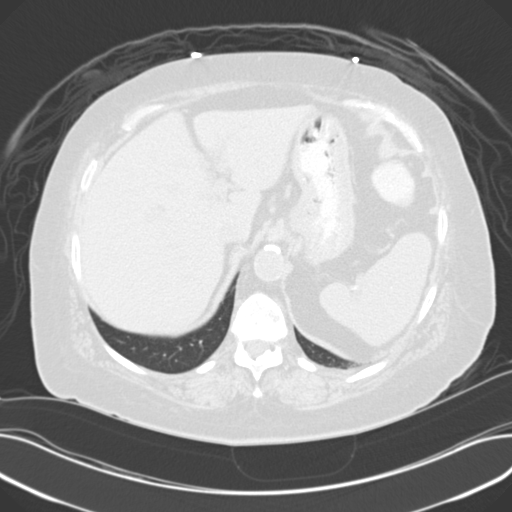
[im 131/150  soft-tissue]
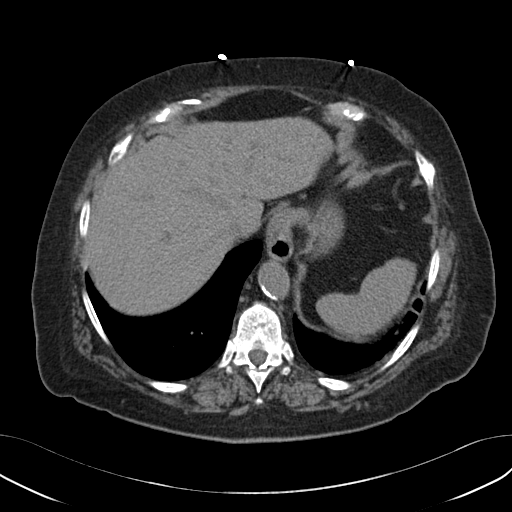
[im 131/150  lung]
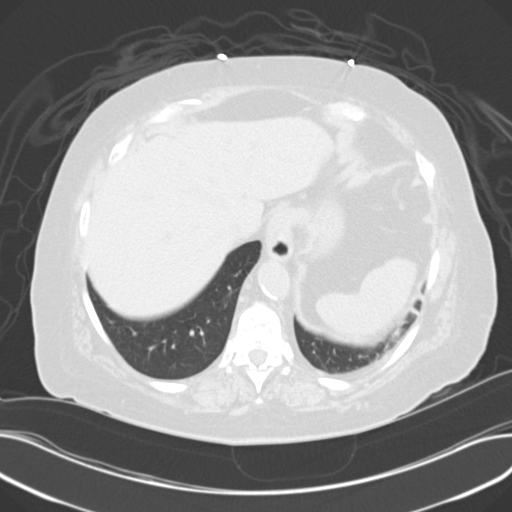
[im 137/150  lung]
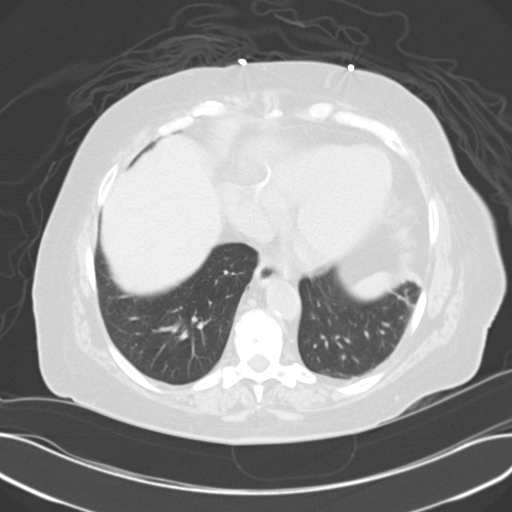
[im 143/150  soft-tissue]
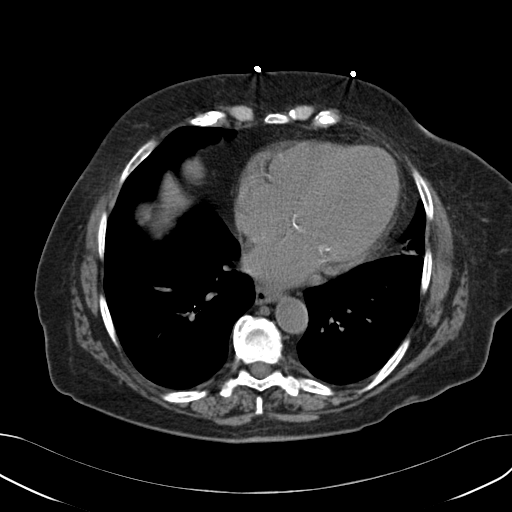
[im 143/150  lung]
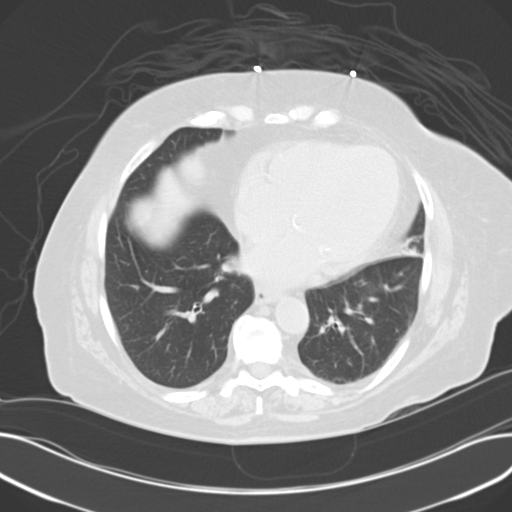

[15 of 32 positions shown; findings below may reference images not displayed]

PROCEDURE:     CT  - CT ABDOMEN AND PELVIS W[DATE]  [DATE]

RESULT:     Axial CT scanning was performed through the abdomen and pelvis
with reconstructions at 3 mm intervals and slice thicknesses. The patient
received oral contrast only. Comparison is made to the study August 02, 2012. Review of multiplanar reconstructed images was performed separately on
the VIA monitor.

The orally administered contrast has traversed the small and large bowel.
There is no evidence of an ileus or obstruction. There are no findings
suspicious for enteritis or  colitis or acute diverticulitis. A normal
appendix is demonstrated. The terminal ileum is normal in appearance.

The kidneys exhibit no evidence of obstruction or inflammatory change. The
gallbladder is surgically absent. The liver, spleen, pancreas, nondistended
stomach, and adrenal glands are normal in appearance. The caliber of the
abdominal aorta is normal. The partially distended urinary bladder is normal
in appearance. The uterus is surgically absent. There are no adnexal masses
. There is no inguinal nor significant umbilical hernia.

The lung bases exhibit minimal atelectasis in the lateral costophrenic
gutter on the left. The lumbar vertebral bodies are preserved in height.
IMPRESSION: 1. There is no evidence of diverticulitis or other forms of colitis. There
is no evidence of bowel obstruction or ileus or perforation. Mild thickening
of the wall of the transverse colon is noted on images 79 through 91 but
this is very similar to that seen on the previous study
2. There is no evidence of urinary tract obstruction nor acute inflammation
of the kidneys.
3. There is no acute hepatobiliary abnormality.
4. There is no evidence of an intra-abdominal abscess more free fluid nor
lymphadenopathy.

[REDACTED]

## 2013-12-21 ENCOUNTER — Ambulatory Visit: Payer: Self-pay

## 2014-01-09 NOTE — Progress Notes (Signed)
This encounter was created in error - please disregard.

## 2014-02-12 ENCOUNTER — Ambulatory Visit: Payer: Self-pay | Admitting: Vascular Surgery

## 2014-02-12 LAB — BASIC METABOLIC PANEL
Anion Gap: 8 (ref 7–16)
BUN: 64 mg/dL — ABNORMAL HIGH (ref 7–18)
CREATININE: 4 mg/dL — AB (ref 0.60–1.30)
Calcium, Total: 9 mg/dL (ref 8.5–10.1)
Chloride: 103 mmol/L (ref 98–107)
Co2: 27 mmol/L (ref 21–32)
EGFR (African American): 12 — ABNORMAL LOW
GFR CALC NON AF AMER: 10 — AB
Glucose: 160 mg/dL — ABNORMAL HIGH (ref 65–99)
Osmolality: 297 (ref 275–301)
Potassium: 4.2 mmol/L (ref 3.5–5.1)
Sodium: 138 mmol/L (ref 136–145)

## 2014-05-13 ENCOUNTER — Inpatient Hospital Stay: Payer: Self-pay

## 2014-05-13 LAB — BASIC METABOLIC PANEL
ANION GAP: 14 (ref 7–16)
BUN: 106 mg/dL — AB (ref 7–18)
CALCIUM: 8.2 mg/dL — AB (ref 8.5–10.1)
Chloride: 97 mmol/L — ABNORMAL LOW (ref 98–107)
Co2: 19 mmol/L — ABNORMAL LOW (ref 21–32)
Creatinine: 7.11 mg/dL — ABNORMAL HIGH (ref 0.60–1.30)
GFR CALC AF AMER: 6 — AB
GFR CALC NON AF AMER: 5 — AB
GLUCOSE: 224 mg/dL — AB (ref 65–99)
Osmolality: 301 (ref 275–301)
Potassium: 4.9 mmol/L (ref 3.5–5.1)
SODIUM: 130 mmol/L — AB (ref 136–145)

## 2014-05-13 LAB — URINALYSIS, COMPLETE
Bilirubin,UR: NEGATIVE
Glucose,UR: 50 mg/dL (ref 0–75)
Ketone: NEGATIVE
Leukocyte Esterase: NEGATIVE
Nitrite: NEGATIVE
PH: 5 (ref 4.5–8.0)
Protein: 100
RBC,UR: 1 /HPF (ref 0–5)
SPECIFIC GRAVITY: 1.009 (ref 1.003–1.030)
WBC UR: 9 /HPF (ref 0–5)

## 2014-05-13 LAB — CBC
HCT: 31.8 % — ABNORMAL LOW (ref 35.0–47.0)
HGB: 10.1 g/dL — ABNORMAL LOW (ref 12.0–16.0)
MCH: 31.3 pg (ref 26.0–34.0)
MCHC: 31.7 g/dL — ABNORMAL LOW (ref 32.0–36.0)
MCV: 99 fL (ref 80–100)
Platelet: 207 10*3/uL (ref 150–440)
RBC: 3.22 10*6/uL — AB (ref 3.80–5.20)
RDW: 14.3 % (ref 11.5–14.5)
WBC: 14 10*3/uL — ABNORMAL HIGH (ref 3.6–11.0)

## 2014-05-13 LAB — PRO B NATRIURETIC PEPTIDE: B-Type Natriuretic Peptide: 13913 pg/mL — ABNORMAL HIGH (ref 0–450)

## 2014-05-13 LAB — TROPONIN I: TROPONIN-I: 0.14 ng/mL — AB

## 2014-05-13 LAB — PROTIME-INR
INR: 1
PROTHROMBIN TIME: 13.3 s (ref 11.5–14.7)

## 2014-05-14 LAB — BASIC METABOLIC PANEL
ANION GAP: 15 (ref 7–16)
BUN: 108 mg/dL — ABNORMAL HIGH (ref 7–18)
Calcium, Total: 8.2 mg/dL — ABNORMAL LOW (ref 8.5–10.1)
Chloride: 98 mmol/L (ref 98–107)
Co2: 20 mmol/L — ABNORMAL LOW (ref 21–32)
Creatinine: 7.39 mg/dL — ABNORMAL HIGH (ref 0.60–1.30)
EGFR (African American): 6 — ABNORMAL LOW
EGFR (Non-African Amer.): 5 — ABNORMAL LOW
Glucose: 160 mg/dL — ABNORMAL HIGH (ref 65–99)
OSMOLALITY: 304 (ref 275–301)
POTASSIUM: 4.7 mmol/L (ref 3.5–5.1)
Sodium: 133 mmol/L — ABNORMAL LOW (ref 136–145)

## 2014-05-14 LAB — CBC WITH DIFFERENTIAL/PLATELET
BASOS PCT: 0.3 %
Basophil #: 0 10*3/uL (ref 0.0–0.1)
EOS PCT: 0 %
Eosinophil #: 0 10*3/uL (ref 0.0–0.7)
HCT: 28.8 % — ABNORMAL LOW (ref 35.0–47.0)
HGB: 9.4 g/dL — AB (ref 12.0–16.0)
LYMPHS ABS: 0.8 10*3/uL — AB (ref 1.0–3.6)
LYMPHS PCT: 7.6 %
MCH: 31.9 pg (ref 26.0–34.0)
MCHC: 32.7 g/dL (ref 32.0–36.0)
MCV: 98 fL (ref 80–100)
Monocyte #: 1.2 x10 3/mm — ABNORMAL HIGH (ref 0.2–0.9)
Monocyte %: 11.9 %
Neutrophil #: 8 10*3/uL — ABNORMAL HIGH (ref 1.4–6.5)
Neutrophil %: 80.2 %
Platelet: 183 10*3/uL (ref 150–440)
RBC: 2.95 10*6/uL — ABNORMAL LOW (ref 3.80–5.20)
RDW: 14 % (ref 11.5–14.5)
WBC: 9.9 10*3/uL (ref 3.6–11.0)

## 2014-05-15 LAB — COMPREHENSIVE METABOLIC PANEL
ALK PHOS: 177 U/L — AB
ANION GAP: 16 (ref 7–16)
Albumin: 2.6 g/dL — ABNORMAL LOW (ref 3.4–5.0)
BUN: 93 mg/dL — ABNORMAL HIGH (ref 7–18)
Bilirubin,Total: 0.7 mg/dL (ref 0.2–1.0)
CHLORIDE: 99 mmol/L (ref 98–107)
CREATININE: 6.41 mg/dL — AB (ref 0.60–1.30)
Calcium, Total: 8.5 mg/dL (ref 8.5–10.1)
Co2: 22 mmol/L (ref 21–32)
EGFR (African American): 7 — ABNORMAL LOW
GFR CALC NON AF AMER: 6 — AB
GLUCOSE: 211 mg/dL — AB (ref 65–99)
OSMOLALITY: 309 (ref 275–301)
POTASSIUM: 4.3 mmol/L (ref 3.5–5.1)
SGOT(AST): 18 U/L (ref 15–37)
SGPT (ALT): 25 U/L
Sodium: 137 mmol/L (ref 136–145)
Total Protein: 7.4 g/dL (ref 6.4–8.2)

## 2014-05-15 LAB — CK TOTAL AND CKMB (NOT AT ARMC)
CK, TOTAL: 147 U/L
CK-MB: 1.2 ng/mL (ref 0.5–3.6)

## 2014-05-18 LAB — PHOSPHORUS: Phosphorus: 4.5 mg/dL (ref 2.5–4.9)

## 2014-05-19 LAB — CBC WITH DIFFERENTIAL/PLATELET
BASOS PCT: 0.4 %
Basophil #: 0 10*3/uL (ref 0.0–0.1)
EOS PCT: 1.9 %
Eosinophil #: 0.2 10*3/uL (ref 0.0–0.7)
HCT: 26.8 % — ABNORMAL LOW (ref 35.0–47.0)
HGB: 8.7 g/dL — AB (ref 12.0–16.0)
LYMPHS PCT: 19.2 %
Lymphocyte #: 1.6 10*3/uL (ref 1.0–3.6)
MCH: 31.9 pg (ref 26.0–34.0)
MCHC: 32.4 g/dL (ref 32.0–36.0)
MCV: 99 fL (ref 80–100)
MONOS PCT: 15.1 %
Monocyte #: 1.2 x10 3/mm — ABNORMAL HIGH (ref 0.2–0.9)
Neutrophil #: 5.2 10*3/uL (ref 1.4–6.5)
Neutrophil %: 63.4 %
PLATELETS: 198 10*3/uL (ref 150–440)
RBC: 2.71 10*6/uL — ABNORMAL LOW (ref 3.80–5.20)
RDW: 14 % (ref 11.5–14.5)
WBC: 8.2 10*3/uL (ref 3.6–11.0)

## 2014-05-19 LAB — COMPREHENSIVE METABOLIC PANEL
ALBUMIN: 2.3 g/dL — AB (ref 3.4–5.0)
AST: 12 U/L — AB (ref 15–37)
Alkaline Phosphatase: 133 U/L — ABNORMAL HIGH
Anion Gap: 9 (ref 7–16)
BILIRUBIN TOTAL: 0.6 mg/dL (ref 0.2–1.0)
BUN: 31 mg/dL — ABNORMAL HIGH (ref 7–18)
CHLORIDE: 97 mmol/L — AB (ref 98–107)
CREATININE: 4.04 mg/dL — AB (ref 0.60–1.30)
Calcium, Total: 8 mg/dL — ABNORMAL LOW (ref 8.5–10.1)
Co2: 31 mmol/L (ref 21–32)
EGFR (Non-African Amer.): 10 — ABNORMAL LOW
GFR CALC AF AMER: 12 — AB
Glucose: 128 mg/dL — ABNORMAL HIGH (ref 65–99)
OSMOLALITY: 282 (ref 275–301)
Potassium: 3.5 mmol/L (ref 3.5–5.1)
SGPT (ALT): 12 U/L — ABNORMAL LOW
Sodium: 137 mmol/L (ref 136–145)
Total Protein: 6.8 g/dL (ref 6.4–8.2)

## 2014-05-19 LAB — PHOSPHORUS: Phosphorus: 3.7 mg/dL (ref 2.5–4.9)

## 2014-05-20 LAB — COMPREHENSIVE METABOLIC PANEL
ALK PHOS: 135 U/L — AB
Albumin: 2.5 g/dL — ABNORMAL LOW (ref 3.4–5.0)
Anion Gap: 12 (ref 7–16)
BUN: 25 mg/dL — ABNORMAL HIGH (ref 7–18)
Bilirubin,Total: 0.7 mg/dL (ref 0.2–1.0)
Calcium, Total: 8.2 mg/dL — ABNORMAL LOW (ref 8.5–10.1)
Chloride: 96 mmol/L — ABNORMAL LOW (ref 98–107)
Co2: 30 mmol/L (ref 21–32)
Creatinine: 3.49 mg/dL — ABNORMAL HIGH (ref 0.60–1.30)
EGFR (African American): 14 — ABNORMAL LOW
EGFR (Non-African Amer.): 12 — ABNORMAL LOW
GLUCOSE: 111 mg/dL — AB (ref 65–99)
Osmolality: 281 (ref 275–301)
Potassium: 3.7 mmol/L (ref 3.5–5.1)
SGOT(AST): 16 U/L (ref 15–37)
SGPT (ALT): 11 U/L — ABNORMAL LOW
Sodium: 138 mmol/L (ref 136–145)
TOTAL PROTEIN: 7 g/dL (ref 6.4–8.2)

## 2014-05-20 LAB — CBC WITH DIFFERENTIAL/PLATELET
Basophil #: 0 10*3/uL (ref 0.0–0.1)
Basophil %: 0.3 %
EOS ABS: 0.1 10*3/uL (ref 0.0–0.7)
Eosinophil %: 1.2 %
HCT: 30 % — ABNORMAL LOW (ref 35.0–47.0)
HGB: 9.6 g/dL — AB (ref 12.0–16.0)
Lymphocyte #: 2.2 10*3/uL (ref 1.0–3.6)
Lymphocyte %: 21.1 %
MCH: 31.8 pg (ref 26.0–34.0)
MCHC: 32.1 g/dL (ref 32.0–36.0)
MCV: 99 fL (ref 80–100)
MONO ABS: 1.4 x10 3/mm — AB (ref 0.2–0.9)
Monocyte %: 13.3 %
NEUTROS ABS: 6.5 10*3/uL (ref 1.4–6.5)
NEUTROS PCT: 64.1 %
PLATELETS: 208 10*3/uL (ref 150–440)
RBC: 3.03 10*6/uL — AB (ref 3.80–5.20)
RDW: 14 % (ref 11.5–14.5)
WBC: 10.2 10*3/uL (ref 3.6–11.0)

## 2014-05-20 LAB — PHOSPHORUS: Phosphorus: 4.5 mg/dL (ref 2.5–4.9)

## 2014-05-21 LAB — COMPREHENSIVE METABOLIC PANEL
ALK PHOS: 126 U/L — AB
ANION GAP: 10 (ref 7–16)
Albumin: 2.3 g/dL — ABNORMAL LOW (ref 3.4–5.0)
BUN: 43 mg/dL — ABNORMAL HIGH (ref 7–18)
Bilirubin,Total: 0.6 mg/dL (ref 0.2–1.0)
CHLORIDE: 91 mmol/L — AB (ref 98–107)
Calcium, Total: 8 mg/dL — ABNORMAL LOW (ref 8.5–10.1)
Co2: 31 mmol/L (ref 21–32)
Creatinine: 5 mg/dL — ABNORMAL HIGH (ref 0.60–1.30)
EGFR (Non-African Amer.): 8 — ABNORMAL LOW
GFR CALC AF AMER: 9 — AB
Glucose: 129 mg/dL — ABNORMAL HIGH (ref 65–99)
Osmolality: 277 (ref 275–301)
Potassium: 3.5 mmol/L (ref 3.5–5.1)
SGOT(AST): 14 U/L — ABNORMAL LOW (ref 15–37)
SGPT (ALT): 11 U/L — ABNORMAL LOW
Sodium: 132 mmol/L — ABNORMAL LOW (ref 136–145)
Total Protein: 6.7 g/dL (ref 6.4–8.2)

## 2014-05-21 LAB — CBC WITH DIFFERENTIAL/PLATELET
BASOS ABS: 0 10*3/uL (ref 0.0–0.1)
Basophil %: 0.3 %
Eosinophil #: 0.3 10*3/uL (ref 0.0–0.7)
Eosinophil %: 2.3 %
HCT: 28.6 % — ABNORMAL LOW (ref 35.0–47.0)
HGB: 9 g/dL — AB (ref 12.0–16.0)
LYMPHS ABS: 2.1 10*3/uL (ref 1.0–3.6)
Lymphocyte %: 19.1 %
MCH: 31 pg (ref 26.0–34.0)
MCHC: 31.4 g/dL — ABNORMAL LOW (ref 32.0–36.0)
MCV: 99 fL (ref 80–100)
MONOS PCT: 11.4 %
Monocyte #: 1.3 x10 3/mm — ABNORMAL HIGH (ref 0.2–0.9)
Neutrophil #: 7.4 10*3/uL — ABNORMAL HIGH (ref 1.4–6.5)
Neutrophil %: 66.9 %
Platelet: 204 10*3/uL (ref 150–440)
RBC: 2.89 10*6/uL — AB (ref 3.80–5.20)
RDW: 13.9 % (ref 11.5–14.5)
WBC: 11.1 10*3/uL — AB (ref 3.6–11.0)

## 2014-05-21 LAB — PHOSPHORUS: Phosphorus: 4.4 mg/dL (ref 2.5–4.9)

## 2014-05-22 ENCOUNTER — Inpatient Hospital Stay: Payer: Self-pay | Admitting: Internal Medicine

## 2014-05-22 LAB — BASIC METABOLIC PANEL
Anion Gap: 10 (ref 7–16)
BUN: 27 mg/dL — ABNORMAL HIGH (ref 7–18)
Calcium, Total: 8.7 mg/dL (ref 8.5–10.1)
Chloride: 94 mmol/L — ABNORMAL LOW (ref 98–107)
Co2: 31 mmol/L (ref 21–32)
Creatinine: 4.69 mg/dL — ABNORMAL HIGH (ref 0.60–1.30)
EGFR (African American): 10 — ABNORMAL LOW
EGFR (Non-African Amer.): 9 — ABNORMAL LOW
Glucose: 194 mg/dL — ABNORMAL HIGH (ref 65–99)
Osmolality: 281 (ref 275–301)
Potassium: 2.9 mmol/L — ABNORMAL LOW (ref 3.5–5.1)
SODIUM: 135 mmol/L — AB (ref 136–145)

## 2014-05-22 LAB — CBC WITH DIFFERENTIAL/PLATELET
BASOS PCT: 0.2 %
Basophil #: 0 10*3/uL (ref 0.0–0.1)
Eosinophil #: 0.1 10*3/uL (ref 0.0–0.7)
Eosinophil %: 0.9 %
HCT: 28.3 % — AB (ref 35.0–47.0)
HGB: 9.3 g/dL — ABNORMAL LOW (ref 12.0–16.0)
Lymphocyte #: 1.6 10*3/uL (ref 1.0–3.6)
Lymphocyte %: 12.5 %
MCH: 32.4 pg (ref 26.0–34.0)
MCHC: 32.9 g/dL (ref 32.0–36.0)
MCV: 98 fL (ref 80–100)
Monocyte #: 1.4 x10 3/mm — ABNORMAL HIGH (ref 0.2–0.9)
Monocyte %: 10.8 %
NEUTROS ABS: 9.7 10*3/uL — AB (ref 1.4–6.5)
NEUTROS PCT: 75.6 %
PLATELETS: 218 10*3/uL (ref 150–440)
RBC: 2.88 10*6/uL — AB (ref 3.80–5.20)
RDW: 14.2 % (ref 11.5–14.5)
WBC: 12.8 10*3/uL — ABNORMAL HIGH (ref 3.6–11.0)

## 2014-05-22 LAB — TROPONIN I: TROPONIN-I: 0.03 ng/mL

## 2014-05-23 LAB — PHOSPHORUS: Phosphorus: 1.3 mg/dL — ABNORMAL LOW (ref 2.5–4.9)

## 2014-05-23 LAB — CBC WITH DIFFERENTIAL/PLATELET
Basophil #: 0.1 10*3/uL (ref 0.0–0.1)
Basophil %: 0.5 %
EOS PCT: 1.5 %
Eosinophil #: 0.2 10*3/uL (ref 0.0–0.7)
HCT: 30 % — ABNORMAL LOW (ref 35.0–47.0)
HGB: 9.4 g/dL — ABNORMAL LOW (ref 12.0–16.0)
LYMPHS ABS: 1.8 10*3/uL (ref 1.0–3.6)
Lymphocyte %: 13.4 %
MCH: 31.3 pg (ref 26.0–34.0)
MCHC: 31.3 g/dL — AB (ref 32.0–36.0)
MCV: 100 fL (ref 80–100)
Monocyte #: 1.4 x10 3/mm — ABNORMAL HIGH (ref 0.2–0.9)
Monocyte %: 10.7 %
Neutrophil #: 9.8 10*3/uL — ABNORMAL HIGH (ref 1.4–6.5)
Neutrophil %: 73.9 %
Platelet: 209 10*3/uL (ref 150–440)
RBC: 3 10*6/uL — AB (ref 3.80–5.20)
RDW: 14.3 % (ref 11.5–14.5)
WBC: 13.3 10*3/uL — AB (ref 3.6–11.0)

## 2014-05-23 LAB — BASIC METABOLIC PANEL
ANION GAP: 10 (ref 7–16)
BUN: 33 mg/dL — ABNORMAL HIGH (ref 7–18)
CALCIUM: 8.5 mg/dL (ref 8.5–10.1)
CO2: 28 mmol/L (ref 21–32)
CREATININE: 5.26 mg/dL — AB (ref 0.60–1.30)
Chloride: 94 mmol/L — ABNORMAL LOW (ref 98–107)
EGFR (African American): 9 — ABNORMAL LOW
EGFR (Non-African Amer.): 7 — ABNORMAL LOW
GLUCOSE: 252 mg/dL — AB (ref 65–99)
Osmolality: 280 (ref 275–301)
POTASSIUM: 4 mmol/L (ref 3.5–5.1)
Sodium: 132 mmol/L — ABNORMAL LOW (ref 136–145)

## 2014-05-24 LAB — BASIC METABOLIC PANEL
Anion Gap: 7 (ref 7–16)
BUN: 17 mg/dL (ref 7–18)
CO2: 33 mmol/L — AB (ref 21–32)
Calcium, Total: 8 mg/dL — ABNORMAL LOW (ref 8.5–10.1)
Chloride: 97 mmol/L — ABNORMAL LOW (ref 98–107)
Creatinine: 3.48 mg/dL — ABNORMAL HIGH (ref 0.60–1.30)
EGFR (African American): 14 — ABNORMAL LOW
GFR CALC NON AF AMER: 12 — AB
GLUCOSE: 127 mg/dL — AB (ref 65–99)
Osmolality: 277 (ref 275–301)
Potassium: 3.7 mmol/L (ref 3.5–5.1)
SODIUM: 137 mmol/L (ref 136–145)

## 2014-05-24 LAB — URINALYSIS, COMPLETE
BILIRUBIN, UR: NEGATIVE
Bacteria: NONE SEEN
Glucose,UR: 50 mg/dL (ref 0–75)
Ketone: NEGATIVE
Nitrite: NEGATIVE
Ph: 6 (ref 4.5–8.0)
Protein: >=500
RBC,UR: 7 /HPF (ref 0–5)
SPECIFIC GRAVITY: 1.012 (ref 1.003–1.030)
Squamous Epithelial: 1
WBC UR: 570 /HPF (ref 0–5)

## 2014-05-24 LAB — URINE CULTURE

## 2014-05-25 LAB — BASIC METABOLIC PANEL
Anion Gap: 5 — ABNORMAL LOW (ref 7–16)
BUN: 24 mg/dL — AB (ref 7–18)
CALCIUM: 7.9 mg/dL — AB (ref 8.5–10.1)
CO2: 31 mmol/L (ref 21–32)
CREATININE: 4.33 mg/dL — AB (ref 0.60–1.30)
Chloride: 98 mmol/L (ref 98–107)
EGFR (Non-African Amer.): 9 — ABNORMAL LOW
GFR CALC AF AMER: 11 — AB
Glucose: 92 mg/dL (ref 65–99)
OSMOLALITY: 272 (ref 275–301)
Potassium: 4 mmol/L (ref 3.5–5.1)
Sodium: 134 mmol/L — ABNORMAL LOW (ref 136–145)

## 2014-05-25 LAB — PHOSPHORUS: PHOSPHORUS: 2.5 mg/dL (ref 2.5–4.9)

## 2014-05-25 LAB — CBC WITH DIFFERENTIAL/PLATELET
BASOS ABS: 0 10*3/uL (ref 0.0–0.1)
Basophil %: 0.4 %
EOS PCT: 2.2 %
Eosinophil #: 0.3 10*3/uL (ref 0.0–0.7)
HCT: 26.5 % — ABNORMAL LOW (ref 35.0–47.0)
HGB: 8.7 g/dL — AB (ref 12.0–16.0)
LYMPHS ABS: 2.6 10*3/uL (ref 1.0–3.6)
LYMPHS PCT: 22.9 %
MCH: 32.3 pg (ref 26.0–34.0)
MCHC: 32.9 g/dL (ref 32.0–36.0)
MCV: 98 fL (ref 80–100)
MONO ABS: 1.2 x10 3/mm — AB (ref 0.2–0.9)
Monocyte %: 10.1 %
NEUTROS ABS: 7.4 10*3/uL — AB (ref 1.4–6.5)
Neutrophil %: 64.4 %
Platelet: 180 10*3/uL (ref 150–440)
RBC: 2.69 10*6/uL — AB (ref 3.80–5.20)
RDW: 14.2 % (ref 11.5–14.5)
WBC: 11.5 10*3/uL — AB (ref 3.6–11.0)

## 2014-05-27 LAB — URINE CULTURE

## 2014-05-28 LAB — CULTURE, BLOOD (SINGLE)

## 2014-06-11 ENCOUNTER — Inpatient Hospital Stay: Payer: Self-pay | Admitting: Internal Medicine

## 2014-06-11 LAB — COMPREHENSIVE METABOLIC PANEL
ALK PHOS: 205 U/L — AB
ALT: 17 U/L
AST: 39 U/L — AB (ref 15–37)
Albumin: 2.5 g/dL — ABNORMAL LOW (ref 3.4–5.0)
Anion Gap: 12 (ref 7–16)
BUN: 14 mg/dL (ref 7–18)
Bilirubin,Total: 0.5 mg/dL (ref 0.2–1.0)
Calcium, Total: 8.1 mg/dL — ABNORMAL LOW (ref 8.5–10.1)
Chloride: 97 mmol/L — ABNORMAL LOW (ref 98–107)
Co2: 30 mmol/L (ref 21–32)
Creatinine: 3.58 mg/dL — ABNORMAL HIGH (ref 0.60–1.30)
GFR CALC AF AMER: 14 — AB
GFR CALC NON AF AMER: 12 — AB
GLUCOSE: 116 mg/dL — AB (ref 65–99)
OSMOLALITY: 279 (ref 275–301)
POTASSIUM: 2.9 mmol/L — AB (ref 3.5–5.1)
Sodium: 139 mmol/L (ref 136–145)
Total Protein: 6.8 g/dL (ref 6.4–8.2)

## 2014-06-11 LAB — CBC
HCT: 29.6 % — AB (ref 35.0–47.0)
HGB: 9.5 g/dL — ABNORMAL LOW (ref 12.0–16.0)
MCH: 31.5 pg (ref 26.0–34.0)
MCHC: 32 g/dL (ref 32.0–36.0)
MCV: 98 fL (ref 80–100)
PLATELETS: 205 10*3/uL (ref 150–440)
RBC: 3.01 10*6/uL — ABNORMAL LOW (ref 3.80–5.20)
RDW: 14.9 % — ABNORMAL HIGH (ref 11.5–14.5)
WBC: 12.4 10*3/uL — ABNORMAL HIGH (ref 3.6–11.0)

## 2014-06-11 LAB — CK TOTAL AND CKMB (NOT AT ARMC)
CK, Total: 57 U/L
CK, Total: 28 U/L
CK, Total: 24 U/L — ABNORMAL LOW
CK-MB: 0.5 ng/mL (ref 0.5–3.6)
CK-MB: 0.8 ng/mL (ref 0.5–3.6)
CK-MB: 0.9 ng/mL (ref 0.5–3.6)

## 2014-06-11 LAB — TROPONIN I
TROPONIN-I: 0.16 ng/mL — AB
Troponin-I: 0.04 ng/mL
Troponin-I: 0.18 ng/mL — ABNORMAL HIGH

## 2014-06-11 LAB — PROTIME-INR
INR: 1
Prothrombin Time: 12.7 secs (ref 11.5–14.7)

## 2014-06-11 LAB — PRO B NATRIURETIC PEPTIDE: B-Type Natriuretic Peptide: 10226 pg/mL — ABNORMAL HIGH (ref 0–450)

## 2014-06-11 LAB — APTT: ACTIVATED PTT: 28.3 s (ref 23.6–35.9)

## 2014-06-11 LAB — MAGNESIUM: Magnesium: 2.1 mg/dL

## 2014-06-12 LAB — CBC WITH DIFFERENTIAL/PLATELET
BASOS PCT: 0.6 %
Basophil #: 0 10*3/uL (ref 0.0–0.1)
Eosinophil #: 0.2 10*3/uL (ref 0.0–0.7)
Eosinophil %: 2.4 %
HCT: 26.3 % — AB (ref 35.0–47.0)
HGB: 8.6 g/dL — ABNORMAL LOW (ref 12.0–16.0)
LYMPHS ABS: 1.7 10*3/uL (ref 1.0–3.6)
LYMPHS PCT: 21.6 %
MCH: 32.2 pg (ref 26.0–34.0)
MCHC: 32.6 g/dL (ref 32.0–36.0)
MCV: 99 fL (ref 80–100)
Monocyte #: 0.8 x10 3/mm (ref 0.2–0.9)
Monocyte %: 9.8 %
Neutrophil #: 5.3 10*3/uL (ref 1.4–6.5)
Neutrophil %: 65.6 %
PLATELETS: 197 10*3/uL (ref 150–440)
RBC: 2.66 10*6/uL — ABNORMAL LOW (ref 3.80–5.20)
RDW: 15 % — ABNORMAL HIGH (ref 11.5–14.5)
WBC: 8 10*3/uL (ref 3.6–11.0)

## 2014-06-13 LAB — BASIC METABOLIC PANEL
ANION GAP: 8 (ref 7–16)
BUN: 30 mg/dL — AB (ref 7–18)
CHLORIDE: 100 mmol/L (ref 98–107)
CO2: 31 mmol/L (ref 21–32)
Calcium, Total: 8.1 mg/dL — ABNORMAL LOW (ref 8.5–10.1)
Creatinine: 4.73 mg/dL — ABNORMAL HIGH (ref 0.60–1.30)
EGFR (African American): 10 — ABNORMAL LOW
EGFR (Non-African Amer.): 8 — ABNORMAL LOW
GLUCOSE: 146 mg/dL — AB (ref 65–99)
Osmolality: 286 (ref 275–301)
POTASSIUM: 4.3 mmol/L (ref 3.5–5.1)
SODIUM: 139 mmol/L (ref 136–145)

## 2014-06-13 LAB — RENAL FUNCTION PANEL
ALBUMIN: 2.1 g/dL — AB (ref 3.4–5.0)
Anion Gap: 9 (ref 7–16)
BUN: 30 mg/dL — AB (ref 7–18)
CALCIUM: 7.8 mg/dL — AB (ref 8.5–10.1)
CHLORIDE: 99 mmol/L (ref 98–107)
Co2: 29 mmol/L (ref 21–32)
Creatinine: 4.75 mg/dL — ABNORMAL HIGH (ref 0.60–1.30)
EGFR (African American): 10 — ABNORMAL LOW
EGFR (Non-African Amer.): 8 — ABNORMAL LOW
GLUCOSE: 245 mg/dL — AB (ref 65–99)
Osmolality: 288 (ref 275–301)
PHOSPHORUS: 4.2 mg/dL (ref 2.5–4.9)
POTASSIUM: 4.2 mmol/L (ref 3.5–5.1)
Sodium: 137 mmol/L (ref 136–145)

## 2014-06-13 LAB — PHOSPHORUS: Phosphorus: 4.4 mg/dL (ref 2.5–4.9)

## 2014-06-14 LAB — HEMOGLOBIN: HGB: 7.9 g/dL — AB (ref 12.0–16.0)

## 2014-06-16 LAB — CULTURE, BLOOD (SINGLE)

## 2014-06-19 ENCOUNTER — Ambulatory Visit: Payer: Self-pay | Admitting: Internal Medicine

## 2014-06-20 ENCOUNTER — Emergency Department: Payer: Self-pay | Admitting: Emergency Medicine

## 2014-06-20 LAB — COMPREHENSIVE METABOLIC PANEL
ALBUMIN: 2.3 g/dL — AB (ref 3.4–5.0)
ANION GAP: 5 — AB (ref 7–16)
Alkaline Phosphatase: 141 U/L — ABNORMAL HIGH
BUN: 5 mg/dL — AB (ref 7–18)
Bilirubin,Total: 0.7 mg/dL (ref 0.2–1.0)
CHLORIDE: 98 mmol/L (ref 98–107)
CREATININE: 1.72 mg/dL — AB (ref 0.60–1.30)
Calcium, Total: 8.1 mg/dL — ABNORMAL LOW (ref 8.5–10.1)
Co2: 34 mmol/L — ABNORMAL HIGH (ref 21–32)
EGFR (Non-African Amer.): 29 — ABNORMAL LOW
GFR CALC AF AMER: 33 — AB
GLUCOSE: 156 mg/dL — AB (ref 65–99)
Osmolality: 274 (ref 275–301)
POTASSIUM: 3.2 mmol/L — AB (ref 3.5–5.1)
SGOT(AST): 23 U/L (ref 15–37)
SGPT (ALT): 11 U/L — ABNORMAL LOW
SODIUM: 137 mmol/L (ref 136–145)
Total Protein: 6.4 g/dL (ref 6.4–8.2)

## 2014-06-20 LAB — CBC
HCT: 26.7 % — ABNORMAL LOW (ref 35.0–47.0)
HGB: 8.8 g/dL — AB (ref 12.0–16.0)
MCH: 32.3 pg (ref 26.0–34.0)
MCHC: 32.9 g/dL (ref 32.0–36.0)
MCV: 98 fL (ref 80–100)
Platelet: 111 10*3/uL — ABNORMAL LOW (ref 150–440)
RBC: 2.72 10*6/uL — ABNORMAL LOW (ref 3.80–5.20)
RDW: 14.9 % — ABNORMAL HIGH (ref 11.5–14.5)
WBC: 9.8 10*3/uL (ref 3.6–11.0)

## 2014-06-20 LAB — CK TOTAL AND CKMB (NOT AT ARMC)
CK, Total: 46 U/L
CK-MB: 0.8 ng/mL (ref 0.5–3.6)

## 2014-06-20 LAB — PROTIME-INR
INR: 1
Prothrombin Time: 12.7 secs (ref 11.5–14.7)

## 2014-06-20 LAB — APTT: ACTIVATED PTT: 29.1 s (ref 23.6–35.9)

## 2014-06-20 LAB — TROPONIN I: Troponin-I: <0.02 ng/mL

## 2014-07-11 ENCOUNTER — Inpatient Hospital Stay: Payer: Self-pay | Admitting: Internal Medicine

## 2014-07-11 LAB — URINALYSIS, COMPLETE
BILIRUBIN, UR: NEGATIVE
Bacteria: NONE SEEN
GLUCOSE, UR: NEGATIVE mg/dL (ref 0–75)
Ketone: NEGATIVE
Nitrite: NEGATIVE
Ph: 5 (ref 4.5–8.0)
Protein: 30
RBC,UR: 421 /HPF (ref 0–5)
Specific Gravity: 1.005 (ref 1.003–1.030)
Squamous Epithelial: NONE SEEN
WBC UR: 61518 /HPF (ref 0–5)

## 2014-07-11 LAB — CK-MB
CK-MB: 7.4 ng/mL — ABNORMAL HIGH (ref 0.5–3.6)
CK-MB: 7.1 ng/mL — AB (ref 0.5–3.6)
CK-MB: 7.6 ng/mL — ABNORMAL HIGH (ref 0.5–3.6)

## 2014-07-11 LAB — MAGNESIUM: MAGNESIUM: 1.6 mg/dL — AB

## 2014-07-11 LAB — COMPREHENSIVE METABOLIC PANEL
ALK PHOS: 106 U/L
Albumin: 2.1 g/dL — ABNORMAL LOW (ref 3.4–5.0)
Anion Gap: 7 (ref 7–16)
BUN: 19 mg/dL — AB (ref 7–18)
Bilirubin,Total: 0.5 mg/dL (ref 0.2–1.0)
CHLORIDE: 96 mmol/L — AB (ref 98–107)
CO2: 34 mmol/L — AB (ref 21–32)
CREATININE: 4.67 mg/dL — AB (ref 0.60–1.30)
Calcium, Total: 7.9 mg/dL — ABNORMAL LOW (ref 8.5–10.1)
EGFR (Non-African Amer.): 9 — ABNORMAL LOW
GFR CALC AF AMER: 10 — AB
Glucose: 103 mg/dL — ABNORMAL HIGH (ref 65–99)
OSMOLALITY: 276 (ref 275–301)
Potassium: 3.1 mmol/L — ABNORMAL LOW (ref 3.5–5.1)
SGOT(AST): 126 U/L — ABNORMAL HIGH (ref 15–37)
SGPT (ALT): 33 U/L
Sodium: 137 mmol/L (ref 136–145)
TOTAL PROTEIN: 6.7 g/dL (ref 6.4–8.2)

## 2014-07-11 LAB — CBC WITH DIFFERENTIAL/PLATELET
Basophil #: 0.1 10*3/uL (ref 0.0–0.1)
Basophil %: 0.4 %
EOS PCT: 0 %
Eosinophil #: 0 10*3/uL (ref 0.0–0.7)
HCT: 23.3 % — ABNORMAL LOW (ref 35.0–47.0)
HGB: 7.4 g/dL — ABNORMAL LOW (ref 12.0–16.0)
LYMPHS ABS: 1 10*3/uL (ref 1.0–3.6)
LYMPHS PCT: 6.8 %
MCH: 31 pg (ref 26.0–34.0)
MCHC: 32 g/dL (ref 32.0–36.0)
MCV: 97 fL (ref 80–100)
MONO ABS: 1.4 x10 3/mm — AB (ref 0.2–0.9)
MONOS PCT: 9.6 %
NEUTROS ABS: 12.4 10*3/uL — AB (ref 1.4–6.5)
NEUTROS PCT: 83.2 %
Platelet: 265 10*3/uL (ref 150–440)
RBC: 2.4 10*6/uL — ABNORMAL LOW (ref 3.80–5.20)
RDW: 14.7 % — AB (ref 11.5–14.5)
WBC: 15 10*3/uL — AB (ref 3.6–11.0)

## 2014-07-11 LAB — PHOSPHORUS: Phosphorus: 4.5 mg/dL (ref 2.5–4.9)

## 2014-07-11 LAB — TROPONIN I
Troponin-I: 14 ng/mL — ABNORMAL HIGH
Troponin-I: 9.64 ng/mL — ABNORMAL HIGH
Troponin-I: 8.9 ng/mL — ABNORMAL HIGH

## 2014-07-11 LAB — PROTIME-INR
INR: 1.1
Prothrombin Time: 14.4 secs (ref 11.5–14.7)

## 2014-07-11 LAB — RAPID INFLUENZA A&B ANTIGENS (ARMC ONLY)

## 2014-07-12 LAB — BASIC METABOLIC PANEL
ANION GAP: 8 (ref 7–16)
BUN: 27 mg/dL — ABNORMAL HIGH (ref 7–18)
CALCIUM: 7.6 mg/dL — AB (ref 8.5–10.1)
Chloride: 91 mmol/L — ABNORMAL LOW (ref 98–107)
Co2: 32 mmol/L (ref 21–32)
Creatinine: 5.34 mg/dL — ABNORMAL HIGH (ref 0.60–1.30)
EGFR (African American): 10 — ABNORMAL LOW
EGFR (Non-African Amer.): 8 — ABNORMAL LOW
Glucose: 173 mg/dL — ABNORMAL HIGH (ref 65–99)
Osmolality: 272 (ref 275–301)
Potassium: 3.1 mmol/L — ABNORMAL LOW (ref 3.5–5.1)
Sodium: 131 mmol/L — ABNORMAL LOW (ref 136–145)

## 2014-07-12 LAB — CBC WITH DIFFERENTIAL/PLATELET
Basophil #: 0 10*3/uL (ref 0.0–0.1)
Basophil %: 0.2 %
EOS PCT: 1.2 %
Eosinophil #: 0.2 10*3/uL (ref 0.0–0.7)
HCT: 22.9 % — AB (ref 35.0–47.0)
HGB: 7.3 g/dL — AB (ref 12.0–16.0)
LYMPHS ABS: 1.8 10*3/uL (ref 1.0–3.6)
LYMPHS PCT: 13.3 %
MCH: 30.7 pg (ref 26.0–34.0)
MCHC: 31.8 g/dL — AB (ref 32.0–36.0)
MCV: 96 fL (ref 80–100)
Monocyte #: 1.3 x10 3/mm — ABNORMAL HIGH (ref 0.2–0.9)
Monocyte %: 9.3 %
NEUTROS ABS: 10.5 10*3/uL — AB (ref 1.4–6.5)
Neutrophil %: 76 %
PLATELETS: 241 10*3/uL (ref 150–440)
RBC: 2.38 10*6/uL — AB (ref 3.80–5.20)
RDW: 15 % — AB (ref 11.5–14.5)
WBC: 13.8 10*3/uL — AB (ref 3.6–11.0)

## 2014-07-13 LAB — URINE CULTURE

## 2014-07-16 LAB — CULTURE, BLOOD (SINGLE)

## 2014-07-19 ENCOUNTER — Ambulatory Visit: Payer: Self-pay | Admitting: Internal Medicine

## 2014-08-19 DEATH — deceased

## 2015-02-05 NOTE — H&P (Signed)
PATIENT NAME:  Alexis Joseph, B[place] MR#:  [id number] DATE OF BIRTH:  1941/07/03  DATE OF ADMISSION:  08/02/2012  PRIMARY CARE PHYSICIAN: Dr. Sherrie Mustache at [facility]  ER PHYSICIAN: Dr. Manson Passey ADMITTING PHYSICIAN: Dr. Tilda Franco  PRESENTING COMPLAINT: Diarrhea, vomiting x1 week.   HISTORY: Patient is a 74 year old lady who was in her usual state of health until about a week ago when had episodes of nausea, vomiting, diarrhea. Vomitus was nonbloody, watery in nature, . This was associated with diarrhea which was nonbloody, liquidy watery, onset of abdominal cramps. Has had repeated episodes of diarrhea, feeling very tired and nauseous and dehydrated. Last vomiting was about two days ago with diarrhea persistent. Denies any fever. No history of long distance travel, sick contact. No recent change in medication, antibiotic use in the last six weeks. For this she was seen in the Emergency Room. Had a CT abdomen which showed noncomplicated diverticulosis but elevated white count of 20 she was referred to hospitalist for further evaluation.    REVIEW OF SYSTEMS: CONSTITUTIONAL: Positive for fatigue, weakness. EYES: No blurred vision, redness or discharge. ENT: No tinnitus, epistaxis, or difficulty swallowing. RESPIRATORY: No cough, shortness of breath. CARDIOVASCULAR: No chest pain, palpitations. No syncope. GASTROINTESTINAL: Positive for nausea, vomiting, diarrhea, abdominal pain, change in bowel habits. GENITOURINARY: Denies dysuria or frequency. ENDOCRINE: Positive for excessive thirst but no polyuria or polydipsia. HEMATOLOGIC: No anemia, easy bruising, bleeding, or swollen glands. SKIN: No rashes, change in hair or skin texture. MUSCULOSKELETAL: No joint pain, redness, discharge, or limited activity. NEURO: No numbness, seizures, memory loss. PSYCH: No anxiety or depression.   PAST MEDICAL HISTORY:  1. Type 2 diabetes.  2. Chronic abdominal pain. 3. Recurrent urinary tract  infection. 4. Polyneuropathy. 5. Diabetes. 6. Inflammatory arthritis.  7. History of  esophagitis with frequent nausea, vomiting.  8. History of pancreatitis. 9. Anemia. 10. Hypoalbuminemia in the past. 11. CVA with left-sided weakness. 12. Diverticulosis. 13. Autonomic dysfunction with orthostatic hypotension.  14. Prior history of transient ischemic attacks. 15. Bilateral carpal tunnel syndrome. 16. Renal insufficiency. 17. Dysphagia. 18. Esophageal dysmotility.   19. History of restless leg syndrome.   PAST SURGICAL HISTORY:  1. Total abdominal hysterectomy and bilateral salpingo-oophorectomy in 1998. 2. Cholecystectomy.  3. Right knee replacement in 2011.   FAMILY HISTORY: Positive for diabetes and breast cancer in the maternal grandmother. Mother died of congestive heart failure. Father in his 17s with no significant medical problems.   SOCIAL HISTORY: Patient is widowed. Nonsmoker. 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 Currently lives with her son and daughter-in-law.   ALLERGIES: Altace, amoxicillin, ciprofloxacin and Phenergan.   CURRENT MEDICATIONS: 1. Clonidine 0.2 mg 3 times daily.  2. Lyrica 75 mg twice daily.  3. Citalopram 40 mg daily. 4. Apidra SoloSTAR sliding scale.  5. FlexPen 18 units sub-Q daily at bedtime for Levemir. 6. Protonix 40 mg daily.  PHYSICAL EXAMINATION:  VITAL SIGNS: Temperature 98.2, pulse 88, respiratory rate 20, blood pressure on arrival 184/85 now is 193/76, oxygen saturation 95 on room air.   GENERAL: Obese, elderly lady lying on the gurney, awake, alert, oriented to time, place, and person, in no distress.   HEENT: Atraumatic, normocephalic. Pupils equal, reactive to light and accommodation. Extraocular movement intact. Mucous membranes pink, dry.   NECK: Supple. No JV distention.   CHEST: Good air entry. Clear to auscultation.   HEART: Regular rate, rhythm. No murmur.   ABDOMEN: Full, obese, moves with  respiration. Bowel sounds  hyperactive. No organomegaly.   EXTREMITIES: No edema. No clubbing. No deformity.   NEUROLOGICAL: No focal motor or sensory deficits.   PSYCH: Affect appropriate to situation.   LABORATORY, DIAGNOSTIC, AND RADIOLOGICAL DATA: CT abdomen shows uncomplicated diverticulosis but no obvious colitis. CBC: White count 20, up from 7.7 from eight months ago. Hemoglobin 13, platelets 253. Chemistry: Potassium 4.9, creatinine 2.6 from baseline of 2.1 from eight months ago. BUN 55, glucose 207, calcium 9.3, lipase 379, alkaline phosphatase 247, AST 39.   IMPRESSION:  1. Gastroenteritis, query infectious, to rule out C. difficile.  2. Accelerated hypertension.  3. Type 2 diabetes, stable.  4. Chronic kidney disease, stable.  5. Hyperlipidemia. 6. Cerebrovascular accident, history stable.  7. Gastroesophageal reflux disease.   PLAN:  1. Admit to general medical floor for IV Flagyl. Check Clostridium difficile, stool ova and parasites and WBCs.  2. Resume outpatient medication and blood pressure control, p.r.n. hydralazine.  3. GI prophylaxis with Protonix. Sliding scale insulin to augment her blood sugar control. Resume her outpatient medications and adjust as needed. Will transfer to Dr. Theodis AguasFisher's service in the morning.  4. CODE STATUS: FULL CODE.   TOTAL PATIENT CARE TIME: 50 minutes.   ____________________________ Floy SabinaMarcel I. Tilda FrancoAkuneme, MD mia:cms D: 08/02/2012 07:41:10 ET T: 08/02/2012 08:12:31 ET JOB#: [id number]  cc: Mallika Sanmiguel I. Tilda FrancoAkuneme, MD, <Dictator> Demetrios Isaacsonald E. Sherrie MustacheFisher, MD Margaret PyleMARCEL I Mykah Shin MD ELECTRONICALLY SIGNED 08/03/2012 0:41

## 2015-02-05 NOTE — Discharge Summary (Signed)
PATIENT NAME:  Alexis Joseph, [place] MR#:  [id number] DATE OF BIRTH:  26-Sep-1941  DATE OF ADMISSION:  08/02/2012 DATE OF DISCHARGE:  08/05/2012  PRIMARY CARE PHYSICIAN: Stann Mainlandavid P. Sampson GoonFitzgerald, MD   DISCHARGE DIAGNOSES:  1. Acute gastroenteritis.  2. Leukocytosis.  3. Coag negative staph bacteremia in two of two sets of blood cultures but only one bottle each.  4. Hypertension.  5. Diabetes.   HISTORY OF PRESENT ILLNESS: This is a pleasant [age] year old woman with hypertension, diabetes, and a history of CVA who presented with a relatively acute onset of a few days of nausea, vomiting, diarrhea, and subjective fevers. She has had no abdominal pain. In the Emergency Room, a CT scan was done which did not show any etiology. Her white count, however, was markedly elevated at 20.0. She was admitted and given Flagyl and ceftriaxone.   HOSPITAL COURSE:  1. Gastroenteritis: The patient's diarrhea resolved at admission. She was unable to provide a stool sample for culture or C. difficile. She had no further vomiting but towards the end of her course did have a little bit of nausea. She was treated with ceftriaxone and Flagyl for four days. She will be discharged on azithromycin as she is allergic to ciprofloxacin. She will also get Flagyl 500 mg twice a day for four days.  2. Leukocytosis: Resolved with treatment.  3. Coag negative staph bacteremia: She had two sets of blood cultures drawn on admission. One bottle in each grew coag-negative staph. This is being further speciated by the micro lab to ensure if they are the same strain or not. She had repeat blood cultures that were negative. She had an echocardiogram with a preliminary read of no endocarditis. She improved markedly without any vancomycin. I suspect this is a contaminant. We will plan on repeating blood cultures after she is off antibiotics for 1 to 2 weeks.  4. Hypertension: She had increase in her blood pressure likely due to the saline she received.  She continued on her clonidine 0.2 t.i.d. and her hydralazine 250 t.i.d. Hydrochlorothiazide was added, and she will be discharged on this and follow up as an outpatient.  5. Diabetes: Her sugars are well controlled on her outpatient regimen.  6. Prior CVA: She was continued on aspirin and Plavix and asymptomatic.   DISCHARGE INSTRUCTIONS:  1. She is to resume her outpatient diet and medications.  2. Home Health: None.  3. Activity limitations: As tolerated.   DISCHARGE FOLLOWUP:  She will follow up with me in clinic in 1 to 2 weeks. At that time we will check her repeat blood cultures once she is off antibiotics. She will call if she has any worsening symptoms.   DISCHARGE MEDICATIONS:  1. Azithromycin 250 mg once a day for 4 days.  2. Flagyl 500 mg p.o. b.i.d. for 5 days.  3. Hydralazine 25 mg t.i.d.  4. Hydrochlorothiazide 25 mg once a day.  5. Clonidine 0.2 mg t.i.d.   6. Citalopram 40 mg once a day.  7. Lyrica 75 mg b.i.d.  8. Pantoprazole 40 mg once a day.  9. Levemir subcutaneous insulin 18 units once a day.  10. Short acting insulin as instructed.    TIME TAKEN: This discharge took 35 minutes.  ____________________________ Stann Mainlandavid P. Sampson GoonFitzgerald, MD dpf:cbb D: 08/05/2012 13:02:58 ET T: 08/06/2012 13:38:36 ET JOB#: [id number]  cc: Stann Mainlandavid P. Sampson GoonFitzgerald, MD, <Dictator> Vaudine Dutan Sampson GoonFITZGERALD MD ELECTRONICALLY SIGNED 08/25/2012 18:44

## 2015-02-08 NOTE — Discharge Summary (Signed)
PATIENT NAME:  Alexis Joseph, Alexis Joseph MR#:  [id number] DATE OF BIRTH:  09-23-1941  DATE OF ADMISSION:  12/10/2012 DATE OF DISCHARGE:  12/13/2012  PRIMARY CARE PHYSICIAN: Clydie Braunavid Nasiyah Laverdiere, MD  DISCHARGE DIAGNOSES: 1.  Acute gastroenteritis with nausea, vomiting, diarrhea and elevated white count.  2.  Acute on chronic renal failure.  3.  Dehydration.  4.  Diabetes.  5.  Hypertension.   HISTORY OF PRESENT ILLNESS: Please see admission history and physical for details. The patient was admitted briefly with acute onset of nausea, vomiting, diarrhea and abdominal pain. She had been unable to hold down any fluids or food.   HOSPITAL COURSE: 1.  Acute gastroenteritis. On admission her white blood count was elevated to 18 and her creatinine was elevated to 3.56 with a BUN of 81. She was treated with IV Bactrim and Flagyl as well as IV fluids. Stool was checked for C. diff, which was negative x 2, as well as for culture, which was negative. Her white count normalized and her BUN and creatinine improved. The patient was seen by physical therapy who recommended home with home health.  2.  Diabetes. Was continued on her outpatient medicines and her hypertensive meds were held, but will be restarted at discharge.   DISCHARGE FOLLOWUP: With Dr. Sampson GoonFitzgerald in 1 to 2 weeks.   DISCHARGE MEDICATIONS: 1.  Apidra SoloSTAR sliding scale.  2.  Clonidine 0.2 mg 3 times a day.  3.  Lyrica 75 mg twice a day.  4.  Pravastatin 20 mg once a day.  5.  Plavix 75 mg once a day.  6.  Protonix 40 mg once a day.  7.  Losartan 50 mg once a day.  8.  Levemir 10 sub-Q once a day at bedtime.  9.  Metronidazole 500 mg twice a day for 3 days.  10.  Bactrim 1 tablet double-strength every 48 hours x 6 days.  HOME HEALTH: The patient will be discharged with home health physical therapy and nursing for fall risk and high risk of readmission.   DISCHARGE DIET: ADA, carbohydrate-controlled, regular consistency diet.   DISCHARGE  ACTIVITY:  The patient can return to regular activities as tolerated.   TIME SPENT: 35 minutes.  ____________________________ Stann Mainlandavid P. Sampson GoonFitzgerald, MD dpf:sb D: 12/13/2012 08:45:36 ET T: 12/13/2012 11:22:59 ET JOB#: [id number]  cc: Stann Mainlandavid P. Sampson GoonFitzgerald, MD, <Dictator> Samyah Bilbo Sampson GoonFITZGERALD MD ELECTRONICALLY SIGNED 12/21/2012 19:26

## 2015-02-08 NOTE — H&P (Signed)
PATIENT NAME:  Alexis Joseph, Alexis Joseph MR#:  [id number] DATE OF BIRTH:  [birth year]  DATE OF ADMISSION:  12/10/2012  PRIMARY CARE PROVIDER:  Dr. Sampson Goon, [facility].   CHIEF COMPLAINT:  Nausea, vomiting, diarrhea, acute renal failure, hypotension.   HISTORY OF PRESENT ILLNESS:  The patient is a 74 year old white female with history of recurrent syncopal episodes due to orthostatic hypotension as a result of autonomic dysfunction, has insulin-dependent diabetes.  Also has chronic abdominal pain, stage 4 chronic kidney disease who was recently hospitalized in December 15th for a fall secondary to syncope.  At that time she was also noticed to have UTI and was treated with antibiotics.  The patient was doing well at home and she since Thursday started having diarrhea.  She reports that she has had multiple episodes of diarrhea, at least 2 to 3 times a day.  Has also had emesis, everything she eats she throws up.  The patient reports that the emesis is whatever she eats.  The diarrhea she did not look at.  She did not notice any blood in the emesis.  She did not notice any blood in the diarrhea.  She has not had any fevers, but she complains of chills.  Beside the antibiotics in December she has not had any other antibiotics recently.  The patient also complains of lower abdominal pain, which she described it as a cramping type of pain, however she has chronic abdominal pain according to her and this is similar to that.  She otherwise denies any chest pains, palpitations or shortness of breath.   PAST MEDICAL HISTORY:   Significant for:  1.  Insulin-dependent diabetes.  2.  Chronic renal failure stage 4.  3.  Chronic abdominal pain.  4.  History of recurrent UTIs.  5.  Polyneuropathy.  6.  Inflammatory arthritis.  7.  Esophagitis.  8.  History of anemia.  9.  History of CVA without any residual deficits.  10.  History of diverticulosis.  11.  History of autonomic dysfunction with orthostatic  hypotension.  12.  History of recurrent TIAs.   PAST SURGICAL HISTORY: 1.  Status post total abdominal hysterectomy.  2.  Status post cholecystectomy.  3.  Status post right knee replacement.   CURRENT MEDICATIONS:  She is on Apidra SoloSTAR pen sliding scale, clonidine 0.2 1 tab by  mouth 3 times a day, Plavix 75 by mouth daily, Levemir 10 units subQ at bedtime, losartan 50 mg daily, Lyrica 75 1 tab by mouth twice a day, pravastatin 20 1 tab by mouth at bedtime, Protonix 40 daily.   SOCIAL HISTORY:  She lives at home with her son.  Does not smoke or drink.   ALLERGIES:  She is allergic to ACE INHIBITOR, ALTACE, AMOXICILLIN, CIPRO, PENICILLIN, PHENERGAN.   REVIEW OF SYSTEMS:  CONSTITUTIONAL:  Denies any fevers.  Complains of some weakness.  Does complain of abdominal pain.  No weight loss.  No weight gain.  EYES:  No blurred or double vision.  No pain.  No redness.  No inflammation.  No glaucoma.  No cataracts.  EARS, NOSE, THROAT:  No tinnitus.  No ear pain.  No hearing loss.  No difficulty swallowing.  RESPIRATORY:  Denies any cough, wheezing, hemoptysis.  No COPD.  No TB.  CARDIOVASCULAR:  Denies any chest pain, orthopnea or edema.  No palpitations.  No syncope.  GASTROINTESTINAL:  Complains of nausea, vomiting and diarrhea, chronic lower abdominal pain.  No hematemesis.  No melena.  No GERD.  No IBS.  No jaundice.  GENITOURINARY:  Denies any dysuria, hematuria, renal calculus or frequency.  ENDOCRINE:  Denies any polydipsia, nocturia or thyroid problems.  HEMATOLOGY AND LYMPHATIC:  Does have history of anemia.  SKIN:  No acne.  No rash.  No changes in mole, hair or skin.  MUSCULOSKELETAL:  Denies any pain in neck, back or shoulder.  Does have osteoarthritis.  NEUROLOGIC:  Denies any asymmetrical weakness.  Does have history of CVA and history of TIAs.  No seizures.  PSYCHIATRIC:  No anxiety.  No insomnia.  No ADD.   PHYSICAL EXAMINATION: VITAL SIGNS:  Temperature 98.2, pulse 81,  respirations 18, blood pressure 117/60, O2 96%.  GENERAL:  The patient is an elderly white female in no acute distress.   HEENT:  Pupils equally round, reactive to light and accommodation.  Extraocular movements intact.  There is no conjunctival pallor.  No scleral icterus.  Nasal exam shows no drainage or ulceration.  Oropharynx, clear without any exudate.  NECK:  No thyromegaly.  No carotid bruits.  CARDIOVASCULAR:  Regular rate and rhythm.  No murmurs, rubs, clicks or gallops.  PMI is not displaced.  LUNGS:  Clear to auscultation bilaterally without any rales, rhonchi or wheezing.  ABDOMEN:  Soft, nontender, nondistended.  Positive bowel sounds x 4.  There is no guarding, no rebound.  No hepatosplenomegaly.  EXTREMITIES:  No clubbing, cyanosis or edema.  SKIN:  No rash.  LYMPHATICS:  No lymph nodes palpable.  VASCULAR:  Good DP, PT pulses.  PSYCHIATRIC:  Not anxious or depressed.  NEUROLOGICAL:  Awake, alert, oriented x 3.  No focal deficits.   LABORATORY AND EVALUATIONS IN THE EMERGENCY DEPARTMENT:  CT scan of the abdomen and pelvis shows no evidence of diverticulitis or other forms of colitis.  There is mild thickening of the wall of the transverse colon is noted on image 79 through 91, but it is similar in appearance to previous study.  Her WBC count was 18.2, hemoglobin 11.9, platelet count was 276.  Glucose 171, BUN 81, creatinine 3.56, sodium 138, potassium 5.0, chloride 107, CO2 is 18.  LFTs showed alkaline phosphatase of 196, AST was 20, ALT 34, lipase was 211.   ASSESSMENT AND PLAN:  The patient is a 74 year old white female with a history of chronic renal failure, orthostatic hypotension, diabetes who presents with diarrhea and emesis since yesterday.  WBC is elevated.  CT scan shows a nonspecific area of thickened colon which is unchanged compared with before.  1.  Nausea, vomiting, diarrhea, likely due to gastroenteritis, but WBC is currently elevated.  She also had hypotension on  presentation.  At this time we will get stool studies including Clostridium difficile.  We will place her on Flagyl and Cipro.  I will ask GI to come evaluate the patient in light of the abnormality on the CT scan to see if she will need a colonoscopy in the near future.  2.  Acute renal failure on chronic renal failure.  We will give her IV fluids, hold her losartan.  3.  Hypertension.  The patient was hypotensive initially.  We will hold her clonidine and losartan.  4.  Diabetes.  We will continue Levemir.  Place her on sliding scale insulin.  5.  Miscellaneous.  We will place her on heparin for deep vein thrombosis prophylaxis.   TIME SPENT:  35 minutes spent.     ____________________________ Lacie Scotts. Allena Katz, MD shp:ea D: 12/10/2012 20:30:04 ET T: 12/10/2012 23:06:55 ET  JOB#: [id number]  cc: Breianna Delfino H. Allena KatzPatel, MD, <Dictator> Charise CarwinSHREYANG H Voncille Simm MD ELECTRONICALLY SIGNED 12/12/2012 12:30

## 2015-02-08 NOTE — Discharge Summary (Signed)
PATIENT NAME:  Alexis Joseph, Alexis Joseph MR#:  [id number] DATE OF BIRTH:  [birth year]  DATE OF ADMISSION:  10/02/2012 DATE OF DISCHARGE:  10/04/2012  DISCHARGE DIAGNOSES: 1. Syncopal episode.  2. History of autonomic dysfunction with orthostatic hypotension.  3. Left 8th rib fracture.  4. Insulin-dependent diabetes.  5. Hypertension.   DISCHARGE MEDICATIONS: 1. Apidra SoloStar Pen. 2. Sliding-scale insulin as directed.  3. Clonidine 0.2 mg p.o. t.i.d.  4. Lyrica 75 mg p.o. b.i.d.  5. Pravastatin 20 mg p.o. at bedtime.  6. Plavix 75 mg p.o. daily.  7. Protonix 40 mg p.o. daily.  8. Insulin detemir 16 units at bedtime.  9. Acetaminophen 325 mg, 2 tabs every 4 hours as needed for pain and fever.  10. Tramadol 50 mg q. 8 hours p.r.n. for pain.  11. Losartan 50 mg p.o. daily.  12. Ondsansetron 4 mg p.o. q. 6 h. as needed for nausea and vomiting.   CONSULTS: None.   PROCEDURES: None.   PERTINENT LABS AND STUDIES:  The patient had a CT of the head that showed no acute abnormalities. Right wrist x-ray showed no definite fracture. Rib film did show a left 8th rib fracture. EKG showed sinus brady and no ST or T wave changes.   On day of discharge: Sodium 142, potassium 4.3, creatinine 2.83. A1c 8.5%. White blood cell count 9.4, hemoglobin 11.9 and platelets 198.0  BRIEF HOSPITAL COURSE:  1. Syncopal episode: It was thought that her syncopal episode was secondary to her underlying autonomic dysfunction with underlying orthostatic hypotension. As of right now, she seems to be a bit more stable. Plan is to have Physical Therapy work with her with observation at the skilled nursing facility. We will defer further evaluation of autonomic dysfunction to Dr. Sampson GoonFitzgerald. I have held her metoprolol because of her bradycardia and her syncopal episodes at this time. Her blood pressures have remained stable. I have added Losartan  once a day to help with her blood pressure control. She has tolerated this medication  very well.  2. Insulin-dependent diabetes: She was noted to have a bit of a low blood sugar during her hospital stay. Therefore, we decreased the detemir to 16 units at bedtime and covered with sliding-scale insulin with Apidra as needed.  3. Hypertension: As stated above, blood pressure remained stable, holding the metoprolol and started the Losartan.   DISPOSITION: The patient is unstable and is a high falls risk. Therefore, [facility]. She will follow up with Dr. Sampson GoonFitzgerald once she gets discharged from that facility.   ____________________________ Marisue IvanKanhka Masin Shatto, MD kl:cb D: 10/04/2012 12:49:27 ET T: 10/04/2012 16:30:46 ET JOB#: [id number]  cc: Marisue IvanKanhka Dia Donate, MD, <Dictator> Stann Mainlandavid P. Sampson GoonFitzgerald, MD Marisue IvanKANHKA Kamerin Axford MD ELECTRONICALLY SIGNED 10/25/2012 17:13

## 2015-02-08 NOTE — Op Note (Signed)
PATIENT NAME:  Alexis Joseph, Alexis Joseph MR#:  [id number] DATE OF BIRTH:  10-May-1941  DATE OF PROCEDURE:  05/10/2013  PREOPERATIVE DIAGNOSIS: Chronic kidney disease, nearing dialysis dependence.   POSTOPERATIVE DIAGNOSIS: Chronic kidney disease, nearing dialysis dependence.   PROCEDURE: Left brachiocephalic AV fistula creation.   SURGEON: Annice NeedyJason S. Salihah Peckham, M.D.   ANESTHESIA: MAC.   ESTIMATED BLOOD LOSS: Approximately 25 mL.   INDICATION FOR PROCEDURE: This is a 74 year old white female who has known chronic kidney disease with a GFR of less than 15. She is nearing dialysis dependence. We are asked to place permanent dialysis access. She had adequate brachial artery and cephalic vein at the antecubital fossa on the left, which is her nondominant arm, and a left brachiocephalic AV fistula was planned.   DESCRIPTION OF PROCEDURE: The patient was brought to the operative suite and after an adequate level of intravenous sedation was obtained, the left upper extremity was sterilely prepped and draped and a sterile surgical field was created. A curvilinear incision was created at the antecubital fossa. Dissected out the cephalic vein, which was a nice generous vein usable for fistula creation. Several branches were ligated and divided between silk ties and we dissected over to the median antecubital vein to swing it closer to the brachial artery. It was then marked for orientation. The brachial artery was dissected out and vessel loops were placed proximally and distally. Control was pulled up on the vessel loops after the patient was systemically heparinized. An anterior wall arteriotomy was created with a #11 blade and extended with Potts scissors. The vein was then ligated distally, cut and beveled to an appropriate length to match the arteriotomy. Anastomosis was created with a 6-0 Prolene suture in the usual fashion. The vessel was flushed and de-aired prior to releasing control. A single 6-0 Prolene patch suture  was used for hemostasis. The wound was then irrigated. Surgicel was placed. The wound was then closed with running 3-0 Vicryl and 4-0 Monocryl. Dermabond was placed as dressing. The patient tolerated the procedure well and was taken to the recovery room in stable condition.    ____________________________ Annice NeedyJason S. Thedora Rings, MD [id number] D: 05/10/2013 16:33:17 ET T: 05/10/2013 19:45:18 ET JOB#: [id number]  cc: Annice NeedyJason S. Neeka Urista, MD, <Dictator> Molli Barrowsaven Voora, MD Annice NeedyJASON S Avril Busser MD ELECTRONICALLY SIGNED 05/15/2013 12:28

## 2015-02-08 NOTE — H&P (Signed)
PATIENT NAME:  Alexis Joseph, Alexis Joseph MR#:  [id number] DATE OF BIRTH:  31-Mar-1941  DATE OF ADMISSION:  10/02/2012  PRIMARY CARE PHYSICIAN: Clydie Braunavid Fitzgerald, MD [facility]  CHIEF COMPLAINT: Status post fall secondary to syncope.   HISTORY OF PRESENT ILLNESS: This is a 74 year old female who was brought in by her son with complaints of passing out spells. She had a syncopal episode causing left rib pain and right wrist injury. She has had a history of CVAs, recurrent orthostatic hypotension with history of autonomic dysfunction in the past. At this time, she denies any chest pain, headaches, vision changes, neck pain or difficulty breathing. Her CT scan upon arrival here in the ER was negative for any acute abnormalities. We were asked to see the patient for admission for further work-up and possible referral to a skilled nursing facility.   PAST MEDICAL HISTORY: 1. Insulin-dependent diabetes with renal deficiency.  2. Chronic abdominal pain.  3. History of recurrent urinary tract infections. 4. Polyneuropathy.  5. Inflammatory arthritis.  6. Esophagitis.  7. Anemia.  8. History of CVA.  9. Diverticulosis. 10. Autonomic dysfunction with orthostatic hypotension.  11. Recurrent transient ischemic attacks.  12. Stage IV kidney disease.   PAST SURGICAL HISTORY:  1. Total abdominal hysterectomy in 1998. 2. History of cholecystectomy. 3. History of right knee replacement in 2011.   HOME MEDICATIONS: 1. Aspirin 81 mg daily. 2. Clonidine 0.2 mg p.o. three times daily. 3. Lyrica 75 mg p.o. twice a day. 4. Plavix 75 mg daily.  5. Vitamin D3 1000 units daily.  6. Co-Q10 daily.  7. Pravachol 20 mg daily.  8. Zyrtec 10 mg daily as needed.  9. Levemir 18 unit subcutaneous injection at bedtime.  10. Pantoprazole 40 mg p.o. daily.  11. Recently placed on metoprolol 25 mg twice a day.  ALLERGIES: SHE HAS A HISTORY OF COUGH RELATED TO ACE INHIBITORS. SHE IS ALLERGIC TO PENICILLIN, ALSO CIPRO AND  PHENERGAN.   REVIEW OF SYSTEMS: No fevers, chills, nausea, vomiting or diarrhea. No chest pain, no dyspnea and no other constitutional symptoms at this time.   SOCIAL HISTORY: She lives at home with her son. She is widowed since March 2013. She is a nonsmoker.   FAMILY HISTORY: Noncontributory.   PHYSICAL EXAMINATION:  VITAL SIGNS: Temperature is 98.1, pulse 58, blood pressure 190/96, respiratory 18 and she is on room air.   GENERAL: This is non-ill-appearing elderly white female who is alert and oriented x 3, in no apparent distress.   HEENT: Extraocular movements are intact. Pupils are equal and reactive to light and accommodation. Vision grossly intact.   NECK: Supple. No tenderness to palpation of the cervical spine.   CARDIOVASCULAR: She is a bit bradycardic. No murmurs, rubs or gallops heard.  RESPIRATORY: Lungs are clear to auscultation bilaterally. She is moving air in all lung fields, although she has some complaints of left rib pain with deep inspiration.  ABDOMEN: Soft, nontender and non-distended. No rebound or guarding.   MUSCULOSKELETAL: Her right wrist is in a soft splint at this time. Otherwise, she has tenderness to palpation along the left rib cage.  NEUROLOGIC: She is alert and oriented x3. Cranial nerves II through XII grossly intact. No focal deficits. Strength intact. Sensation intact.   PSYCH: Denies any depression at this time.   PERTINENT LABS: CT of the head showed no acute abnormalities. It did show atrophy with chronic microvascular ischemic disease and right basal ganglia lacunar infarct.   Right wrist x-ray showed no  definitive fracture. There was some soft tissue swelling at the site of the distal radius seen on x-ray.   EKG showed sinus brady with a rate of 58. No ST or T wave changes.  Sodium 138, potassium 4.2, BUN 43, creatinine 2.71 and glucose 161. Troponin less than 0.02. White blood cell count 10.6, hemoglobin 12.3 and platelets 223.    ASSESSMENT AND PLAN: 1. Syncope. The patient has a history of recurrent syncope with history of autonomic dysfunction with orthostatic hypotension. While she was in the ER, it was noted that her blood pressures did fluctuate. Head CT was negative for any acute abnormalities. I believe that her syncope was likely due to underlying autonomic dysfunction. Plan is to hold the beta blocker at this time as she was bradycardic. I will replace that with losartan to help control  blood pressure a little bit more. May end up needing for her to see cardiology as an outpatient to help with this autonomic dysfunction. We will continue with the clonidine at this time, but may address switching to a different medication after discussing this with Dr. Sampson Goon as an outpatient.  2. Chronic kidney disease. The patient has a history of stage IV chronic kidney disease, essentially unchanged. Last creatinine was 2.9 back in October 2013. Creatinine here was 2.71, so essentially unchanged. We will add ARB to help with renal protection.  3. Insulin-dependent diabetes. Her last A1c as an outpatient was 7.8%; that was in October 2013. Sugar seems to be stable at this time. We will continue with sliding scale insulin and continue with the home Levemir 18 units at bedtime.  4. Hypertension. 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 We will stop the metoprolol at this time because of the bradycardia. We will start on losartan, as described above. Also cover with clonidine at this point. If we need to add hydralazine we can. I will hold off for now.   DISPOSITION: She is a FULL CODE. Plan to admit her as an inpatient. We will get case management to evaluate her to see if she qualifies for skilled nursing facility, also get physical therapy to evaluate her to see what needs she may have. We will cover her with heparin as prophylaxis at this time. Continue with her home PPI as well. The  patient is being admitted to the telemetry floor as inpatient status. ____________________________ Marisue Ivan, MD kl:sb D: 10/02/2012 08:40:54 ET T: 10/02/2012 09:37:42 ET JOB#: [id number]  cc: Marisue Ivan, MD, <Dictator> Marisue Ivan MD ELECTRONICALLY SIGNED 10/25/2012 17:13

## 2015-02-09 NOTE — Consult Note (Signed)
CHIEF COMPLAINT and HISTORY:  Subjective/Chief Complaint passing out   History of Present Illness Patient is a 74 yo WF with ESRD and multiple other medical issues is admitted two days ago with syncope versus cardiac arrest.  She has been seen by cardiology, and outpatient monitoring being considered.  No focal arm or leg weakness or numbness, speech or swallowing issues, or temporary monocular blindness.  No obvious residual defecits.  Seen while on dialysis today.  No complaints and hoping to go home later today.  Not a great historian.   Has had an extensive work up with multiple studies.  These included a carotid duplex that suggests moderate ICA stenosis bilaterally.  In review of the images and the velocities, the degree of stenosis is likely on the low end of 50-60% versus the higher end of 1-49%.   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:    Lower dental implants:    Hysterectomy - Partial:   ALLERGIES:  Allergies:  Phenergan: Alt Ment Status, Swelling  Ace Inhibitors: Cough  Altace: Cough  Cipro: Rash  Penicillin: Itching, Swelling  Amoxicillin: Itching, Swelling  HOME MEDICATIONS:  Home Medications: Medication Instructions Status  Zofran 4 mg oral tablet 1 tab(s) orally every 8 hours, As Needed - for Nausea, Vomiting Active  losartan 100 mg oral tablet 1 tab(s) orally once a day Active  amLODIPine 5 mg oral tablet 1 tab(s) orally once a day Active  senna 1 tab(s) orally 2 times a day, As Needed, constipation , As needed, constipation Active  docusate sodium 100 mg oral capsule 1 cap(s) orally 2 times a day, As Needed, Stool Softener , As needed, Stool Softener Active   clonidine 0.2 mg oral tablet 1 tab(s) orally 2 times a day Active  pantoprazole 40 mg oral delayed release tablet 1 tab(s) orally once a day (in the morning) Active  Vitamin D3 2000 intl units oral tablet 1 tab(s) orally once a day Active  Plavix 75 mg oral tablet 1 tab(s) orally once a day Active  HumaLOG KwikPen 100 units/mL subcutaneous solution  subcutaneous 4 times a day (before meals and at bedtime), As Needed per sliding scale Active  Ferrousal 325 mg oral tablet 1 tab(s) orally once a day Active  Levemir FlexPen 100 units/mL subcutaneous solution 7 unit(s) subcutaneous once a day (at bedtime) Active  pravastatin 20 mg oral tablet 1 tab(s) orally once a day Active  Neurontin 300 mg oral capsule 1 cap(s) orally 2 times a day Active   Family and Social History:  Family History Non-Contributory   Social History negative tobacco, negative ETOH   Place of Living Home   Review of Systems:  Subjective/Chief Complaint syncope ESRD   Fever/Chills No   Cough No   Sputum No   Abdominal Pain No   Diarrhea No   Constipation No   Nausea/Vomiting No   SOB/DOE No   Chest Pain No   Dysuria No   Tolerating PT Yes   Tolerating Diet Yes   Medications/Allergies Reviewed Medications/Allergies reviewed   Physical Exam:  GEN well developed, well nourished   HEENT pink conjunctivae,  moist oral mucosa   NECK No masses  trachea midline   RESP normal resp effort  no use of accessory muscles   CARD regular rate  no JVD   VASCULAR ACCESS AV graft present  left arm.  Currently hooked up to dialysis machine, working well   ABD denies tenderness  soft   GU no superpubic tenderness   LYMPH negative neck, negative axillae   EXTR negative cyanosis/clubbing, positive edema   SKIN normal to palpation, skin turgor good   NEURO cranial nerves intact, follows commands, motor/sensory function intact   PSYCH alert, A+O to time, place, person, not a great historian   LABS:   Laboratory Results: LabObservation:    25-Aug-15 13:42, Echo Doppler  OBSERVATION   Reason for Test  Hepatic:    24-Aug-15 12:39, Comprehensive Metabolic Panel  Bilirubin, Total 0.5  Alkaline Phosphatase 205  46-116  NOTE: New Reference Range  05/08/14  SGPT (ALT) 17  14-63  NOTE: New Reference Range  05/08/14  SGOT (AST) 39  Total Protein, Serum 6.8  Albumin, Serum 2.5    26-Aug-15 09:45, Renal Function Panel  Albumin, Serum 2.1  Routine Micro:    24-Aug-15 17:24, Blood Culture  Micro Text Report   BLOOD CULTURE    COMMENT                   NO GROWTH IN 18-24 HOURS     ANTIBIOTIC  Culture Comment   NO GROWTH IN 18-24 HOURS   Result(s) reported on 12 Jun 2014 at 05:00PM.    24-Aug-15 20:36, Blood Culture  Micro Text Report   BLOOD CULTURE    COMMENT                   NO GROWTH IN 18-24 HOURS     ANTIBIOTIC  Culture Comment   NO GROWTH IN 18-24 HOURS   Result(s) reported on 12 Jun 2014 at 08:00PM.  Lab:    24-Aug-15 12:55, ABG and Lactic Acid  pH (ABG) 7.45  7.350-7.450  NOTE: New Reference Range  05/12/14  PCO2 43  32-48  NOTE: New Reference Range  05/29/14  PO2 66  83-108  NOTE: New Reference Range  05/12/14  FiO2 21  Base Excess 5.2  -3-3  NOTE: New Reference Range  05/29/14  HCO3 29.9  21.0-28.0  NOTE: New Reference Range  05/12/14  O2 Saturation 94.9  Specimen Site (ABG)   RT BRACHIAL  Specimen Type (ABG) ARTERIAL  Patient Temp (ABG) 37.0  Result(s) reported on 11 Jun 2014 at 01:09PM.  Cardiology:    24-Aug-15 12:58, ECG  Ventricular Rate 80  Atrial Rate 80  P-R Interval 144  QRS Duration 84  QT 400  QTc 461  P Axis 10  R Axis 43  T Axis 30  ECG interpretation   Normal sinus rhythm  Minimal voltage criteria for LVH, may be normal variant  ST abnormality, possible digitalis effect  Abnormal ECG  When compared with ECG of 22-May-2014 19:12,  Non-specific change in ST segment in Inferior leads  Non-specific change in ST segment in  Lateral leads  Nonspecific T wave abnormality now evident in Inferior leads  T wave inversion no longer evident in Lateral leads  ----------unconfirmed----------  Confirmed by OVERREAD, NOT ([id number]), editor PEARSON, BARBARA ([id number]) on 06/12/2014 1:33:13 PM  ECG     25-Aug-15 13:42, Echo Doppler  Echo Doppler   REASON FOR EXAM:      COMMENTS:  PROCEDURE: [facility] - ECHO DOPPLER COMPLETE(TRANSTHOR)  - Jun 12 2014  1:42PM     RESULT: Echocardiogram Report    Patient Name:   Alexis Joseph Date of Exam: 06/12/2014  Medical Rec #:  [id number]           Custom1:  Date of Birth:  [birth year]        Height:       64.0 in  Patient Age:    60 years         Weight:       169.0 lb  Patient Gender: F                BSA:          1.82 m??    Indications: Syncope  Sonographer:    Sherrie Sport RDCS  Referring Phys: Ether Griffins, Sylvester    Summary:   1. Left ventricular ejection fraction, by visual estimation, is 55 to   60%.   2. Mild concentric left ventricular hypertrophy.   3. Mildly increased left ventricular septal thickness.   4. Decreased left ventricular internal cavitysize.   5. Mild mitral valve regurgitation.   6. Mildly increased left ventricular posterior wall thickness.   7. Mild tricuspid regurgitation.  2D AND M-MODE MEASUREMENTS (normal ranges within parentheses):  Left Ventricle:          Normal   AoV Cusp Separation: 0.90 cm (1.5-2.6)  IVSd (2D):      1.67 cm (0.7-1.1)  LVPWd (2D):     1.54 cm (0.7-1.1) Aorta/LA:                  Normal  LVIDd (2D):     3.11 cm (3.4-5.7) Aortic Root (2D): 3.00 cm (2.4-3.7)  LVIDs (2D):     2.23 cm           AoV Cusp Exc:     0.90 cm (1.5-2.6)  LV FS (2D):     28.3 %   (>25%)   Left Atrium (2D): 3.70 cm (1.9-4.0)  LV EF (2D):     56.1 %   (>50%)                                      Right Ventricle:                                    RVd (2D):        3.42 cm  LV DIASTOLIC FUNCTION:  MV Peak E: 1.13 m/s E/e' Ratio: 19.70  MV Peak A: 0.94 m/s Decel Time:  248 msec  E/A Ratio: 1.20  SPECTRAL DOPPLER ANALYSIS (where applicable):  Mitral Valve:  MV P1/2 Time: 71.92 msec  MV Area, PHT: 3.06 cm??  Aortic Valve: AoV Max Vel: 1.38 m/s AoV Peak PG: 7.6 mmHg AoV Mean PG:   4.2 mmHg  LVOT Vmax: 0.90 m/s LVOT VTI: 0.257 m LVOT Diameter: 2.10 cm  AoV Area, Vmax: 2.25 cm?? AoV Area, VTI: 2.87 cm?? AoV Area, Vmn: 2.01 cm??  Tricuspid Valve and PA/RV Systolic Pressure: TR Max Velocity: 2.61 m/s RA   Pressure: 5 mmHg RVSP/PASP: 32.2 mmHg  Pulmonic Valve:  PV Max Velocity: 0.86 m/s PV Max PG: 3.0 mmHg PV Mean PG:    PHYSICIAN INTERPRETATION:  Left Ventricle: The left ventricular internal cavity size was  decreased.   LV septal wall thickness was mildly increased. LV posterior wall   thickness was mildly increased. Mild concentric left ventricular   hypertrophy. Left ventricular ejection fraction, by visual estimation, is   55 to 60%.  Right Ventricle: The right ventricular size is normal. Global RV systolic   function is normal.  Left Atrium: The left atrium is normal in size.  Mitral Valve: Mild mitral valve regurgitation is seen.  Tricuspid Valve: The tricuspid valve is not well seen. Mild tricuspid   regurgitation isvisualized. The tricuspid regurgitant velocity is 2.61   m/s, and with an assumed right atrial pressure of 5 mmHg, the estimated   right ventricular systolic pressure is normal at 32.2 mmHg.  Aortic Valve: The aortic valve was not well seen. The aortic valve is   structurally normal, with no evidence of sclerosis or stenosis.    [place] MD  Electronically signed by 1916 Bartholome Bill MD  Signature Date/Time: 06/12/2014/5:01:44 PM  *** Final ***    IMPRESSION: .        Verified By: Lisbeth Ply. Ubaldo Glassing, M.D., MD  Routine Chem:    24-Aug-15 12:39, B-Type Natriuretic Peptide [facility])  B-Type Natriuretic Peptide [facility]) [id number]  Result(s) reported on 11 Jun 2014 at 02:44PM.    24-Aug-15 12:39, Comprehensive Metabolic Panel   Glucose, Serum 116  BUN 14  Creatinine (comp) 3.58  Sodium, Serum 139  Potassium, Serum 2.9  Chloride, Serum 97  CO2, Serum 30  Calcium (Total), Serum 8.1  Osmolality (calc) 279  eGFR (African American) 14  eGFR (Non-African American) 12  eGFR values <36m/min/1.73 m2 may be an indication of chronic  kidney disease (CKD).  Calculated eGFR is useful in patients with stable renal function.  The eGFR calculation will not be reliable in acutely ill patients  when serum creatinine is changing rapidly. It is not useful in   patients on dialysis. The eGFR calculation may not be applicable  to patients at the low and high extremes of body sizes, pregnant  women, and vegetarians.  Anion Gap 12    24-Aug-15 12:39, Prothrombin Time  Result Comment   PT/INR/PTT - SAMPLE QUANTITY NOT SUFFICIENT FOR ANALYSIS   - Specimen improperly drawn. Spoke with   -Ovid Curdin ER @ 1420 06/11/14. BGB   Result(s) reported on 11 Jun 2014 at 02:23PM.    24-Aug-15 16:23, Magnesium, Serum  Magnesium, Serum 2.1  1.8-2.4  THERAPEUTIC RANGE: 4-7 mg/dL  TOXIC: > 10 mg/dL   -----------------------    24-Aug-15 16:23, Troponin I  Result Comment   TROPONIN - RESULTS VERIFIED BY REPEAT TESTING.   - CALLED TO AND READ BACK BY SHANNON HATCH   - 160608/24/15 SAL   Result(s) reported on 11 Jun 2014 at 05:36PM.    24-Aug-15 20:36, Troponin I  Result Comment   TROPONIN - RESULTS VERIFIED BY REPEAT TESTING.   - P CALLED TO SHANNON HATCH [id number] 06/11/14   - BY SAL   Result(s) reported on 11 Jun 2014 at 09:28PM.    26-Aug-15 004:59 Basic Metabolic Panel (w/Total Calcium)  Glucose, Serum 146  BUN 30  Creatinine (comp) 4.73  Sodium, Serum 139  Potassium, Serum 4.3  Chloride, Serum 100  CO2, Serum 31  Calcium (Total), Serum 8.1  Anion Gap 8  Osmolality (calc) 286  eGFR (African American) 10  eGFR (Non-African American) 8  eGFR values <660mmin/1.73 m2 may be an indication of chronic  kidney disease  (CKD).  Calculated eGFR is  useful in patients with stable renal function.  The eGFR calculation will not be reliable in acutely ill patients  when serum creatinine is changing rapidly. It is not useful in   patients on dialysis. The eGFR calculation may not be applicable  to patients at the low and high extremes of body sizes, pregnant  women, and vegetarians.    26-Aug-15 09:45, Phosphorus, Serum  Phosphorus, Serum 4.4  Result(s) reported on 13 Jun 2014 at 10:17AM.    26-Aug-15 09:45, Renal Function Panel  Glucose, Serum 245  BUN 30  Creatinine (comp) 4.75  Sodium, Serum 137  Potassium, Serum 4.2  Chloride, Serum 99  CO2, Serum 29  Calcium (Total), Serum 7.8  Phosphorus, Serum 4.2  Anion Gap 9  Osmolality (calc) 288  eGFR (African American) 10  eGFR (Non-African American) 8  eGFR values <51m/min/1.73 m2 may be an indication of chronic  kidney disease (CKD).  Calculated eGFR is useful in patients with stable renal function.  The eGFR calculation will not be reliable in acutely ill patients  when serum creatinine is changing rapidly. It is not useful in   patients on dialysis. The eGFR calculation may not be applicable  to patients at the low and high extremes of body sizes, pregnant  women, and vegetarians.  Cardiac:    24-Aug-15 12:39, Cardiac Panel  CK, Total 57  26-192  NOTE: NEW REFERENCE RANGE   11/20/2013  CPK-MB, Serum 0.5  Result(s) reported on 11 Jun 2014 at 02:44PM.    24-Aug-15 12:39, Troponin I  Troponin I 0.04  0.00-0.05  0.05 ng/mL or less: NEGATIVE   Repeat testing in 3-6 hrs   if clinically indicated.  >0.05 ng/mL: POTENTIAL   MYOCARDIAL INJURY. Repeat   testing in 3-6 hrs if   clinically indicated.  NOTE: An increase or decrease   of 30% or more on serial   testing suggests a   clinically important change    24-Aug-15 16:23, Cardiac Panel  CK, Total 28  26-192  NOTE: NEW REFERENCE RANGE   11/20/2013  CPK-MB, Serum 0.8  Result(s) reported  on 11 Jun 2014 at 05:32PM.    24-Aug-15 16:23, Troponin I  Troponin I 0.18  0.00-0.05  0.05 ng/mL or less: NEGATIVE   Repeat testing in 3-6 hrs   if clinically indicated.  >0.05 ng/mL: POTENTIAL   MYOCARDIAL INJURY. Repeat   testing in 3-6 hrs if   clinically indicated.  NOTE: An increase or decrease   of 30% or more on serial   testing suggests a   clinically important change    24-Aug-15 20:36, Cardiac Panel  CK, Total 24  26-192  NOTE: NEW REFERENCE RANGE   11/20/2013  CPK-MB, Serum 0.9  Result(s) reported on 11 Jun 2014 at 09:24PM.    24-Aug-15 20:36, Troponin I  Troponin I 0.16  0.00-0.05  0.05 ng/mL or less: NEGATIVE   Repeat testing in 3-6 hrs   if clinically indicated.  >0.05 ng/mL: POTENTIAL   MYOCARDIAL INJURY. Repeat   testing in 3-6 hrs if   clinically indicated.  NOTE: An increase or decrease   of 30% or more on serial   testing suggests a   clinically important change  Routine Coag:    24-Aug-15 12:39, Activated PTT  Activated PTT (APTT) -  A HCT value >55% may artifactually increase the APTT. In one study,  the increase was an average of 19%.  Reference: "Effect on Routine and Special Coagulation Testing Values  of Citrate  Anticoagulant Adjustment in Patients with High HCT Values."  American Journal of Clinical Pathology 2006;126:400-405.    24-Aug-15 12:39, Prothrombin Time  Prothrombin -  INR -  INR reference interval applies to patients on anticoagulant therapy.  A single INR therapeutic range for coumarins is not optimal for all  indications; however, the suggested range for most indications is  2.0 - 3.0.  Exceptions to the INR Reference Range may include: Prosthetic heart  valves, acute myocardial infarction, prevention of myocardial  infarction, and combinations of aspirin and anticoagulant. The need  for a higher or lower target INR must be assessed individually.  Reference: The Pharmacology and Management of the Vitamin K   antagonists:  the seventh ACCP Conference on Antithrombotic and  Thrombolytic Therapy. INOMV.6720 Sept:126 (3suppl): [id number].  A HCT value >55% may artifactually increase the PT.  In one study,   the increase was an average of 25%.  Reference:  "Effect on Routine and Special Coagulation Testing Values  of Citrate Anticoagulant Adjustment in Patients with High HCT Values."  American Journal of Clinical Pathology 2006;126:400-405.    24-Aug-15 15:44, Activated PTT  Activated PTT (APTT) 28.3  A HCT value >55% may artifactually increase the APTT. In one study,  the increase was an average of 19%.  Reference: "Effect on Routine and Special Coagulation Testing Values  of Citrate Anticoagulant Adjustment in Patients with High HCT Values."  American Journal of Clinical Pathology 2006;126:400-405.    24-Aug-15 15:44, Prothrombin Time  Prothrombin 12.7  INR 1.0  INR reference interval applies to patients on anticoagulant therapy.  A single INR therapeutic range for coumarins is not optimal for all  indications; however, the suggested range for most indications is  2.0 - 3.0.  Exceptions to the INR Reference Range may include: Prosthetic heart  valves, acute myocardial infarction, prevention of myocardial  infarction, and combinations of aspirin and anticoagulant. The need  for a higher or lower target INR must be assessed individually.  Reference: The Pharmacology and Management of the Vitamin K   antagonists: the seventh ACCP Conference on Antithrombotic and  Thrombolytic Therapy. NOBSJ.6283 Sept:126 (3suppl): [id number].  A HCT value >55% may artifactually increase the PT.  In one study,   the increase was an average of 25%.  Reference:  "Effect on Routine and Special Coagulation Testing Values  of Citrate Anticoagulant Adjustment in Patients with High HCT Values."  American Journal of Clinical Pathology 2006;126:400-405.  Routine Hem:    24-Aug-15 12:39, Hemogram, Platelet Count  WBC (CBC) 12.4  RBC  (CBC) 3.01  Hemoglobin (CBC) 9.5  Hematocrit (CBC) 29.6  Platelet Count (CBC) 205  Result(s) reported on 11 Jun 2014 at 02:35PM.  MCV 98  MCH 31.5  MCHC 32.0  RDW 14.9    25-Aug-15 05:07, CBC Profile  WBC (CBC) 8.0  RBC (CBC) 2.66  Hemoglobin (CBC) 8.6  Hematocrit (CBC) 26.3  Platelet Count (CBC) 197  MCV 99  MCH 32.2  MCHC 32.6  RDW 15.0  Neutrophil % 65.6  Lymphocyte % 21.6  Monocyte % 9.8  Eosinophil % 2.4  Basophil % 0.6  Neutrophil # 5.3  Lymphocyte # 1.7  Monocyte # 0.8  Eosinophil # 0.2  Basophil # 0.0  Result(s) reported on 12 Jun 2014 at 05:30AM.   RADIOLOGY:  Radiology Results: XRay:    26-Jul-15 15:55, Chest Portable Single View  Chest Portable Single View  REASON FOR EXAM:    sob  COMMENTS:       PROCEDURE: DXR -  DXR PORTABLE CHEST SINGLE VIEW  - May 13 2014  3:55PM     CLINICAL DATA:  74 year old female with shortness of breath    EXAM:  PORTABLE CHEST - 1 VIEW    COMPARISON:  10/02/2012 and prior radiographs    FINDINGS:  Cardiomegaly and mild pulmonary edema identified.  Left basilar atelectasis versus scarring noted.    No definite pleural effusion or pneumothorax noted.    No acute bony abnormalities are present.     IMPRESSION:  Cardiomegaly with mild pulmonary edema.      Electronically Signed    By: Hassan Rowan M.D.    On: 05/13/2014 16:05       Verified By: Lura Em, M.D.,    28-Jul-15 10:03, Chest Portable Single View  Chest Portable Single View  REASON FOR EXAM:    interval change  COMMENTS:       PROCEDURE: DXR - DXR PORTABLE CHEST SINGLE VIEW  - May 15 2014 10:03AM     CLINICAL DATA:  Nausea    EXAM:  PORTABLE CHEST - 1 VIEW    COMPARISON:  Portable exam 0957 hr compared to 05/13/2014    FINDINGS:  Enlargement of cardiac silhouette.  Slight pulmonary vascular congestion.    Increased pulmonary infiltrates bilaterally greater on RIGHT likely  asymmetric edema.    Questionable LEFT pleural  effusion.    No pneumothorax.    Atheroscleroticcalcification aorta noted.    Diffuse osseous demineralization.     IMPRESSION:  Slightly increased pulmonary edema.  Electronically Signed    By: Lavonia Dana M.D.    On: 05/15/2014 10:26         Verified By: Burnetta Sabin, M.D.,    01-Aug-15 07:20, Chest PA and Lateral  Chest PA and Lateral  REASON FOR EXAM:    CHF  COMMENTS:       PROCEDURE: DXR - DXR CHEST PA (OR AP) AND LATERAL  - May 19 2014  7:20AM     CLINICAL DATA:  CHF.    EXAM:  CHEST  2 VIEW    COMPARISON:  05/15/2014    FINDINGS:  There has been interval placement of a right jugular dual-lumen  central venous catheter with tips overlying the lower SVC. The  cardiac silhouette remains enlarged. Thoracic aortic calcification  is again seen. There is persistent pulmonary vascular congestion.  Interstitial opacities on the prior study have improved, most  notably in the right mid and lower lung. There is also improved  aeration of the retrocardiac left lower lobe. No definite pleural  effusion or pneumothorax is identified.     IMPRESSION:  New central venous catheter.  Improved interstitial edema.      Electronically Signed    By: Logan Bores    On: 05/19/2014 08:18     Verified By: Ferol Luz, M.D.,    04-Aug-15 20:34, Chest Portable Single View  Chest Portable Single View  REASON FOR EXAM:    weakness, NH resident, possible aspiration  COMMENTS:       PROCEDURE: DXR - DXR PORTABLE CHEST SINGLE VIEW  - May 22 2014  8:34PM     CLINICAL DATA:  Weakness, possible aspiration    EXAM:  PORTABLE CHEST - 1 VIEW    COMPARISON:05/19/2014; 05/15/2014    FINDINGS:  Grossly unchanged enlarged cardiac silhouette and mediastinal  contours with atherosclerotic plaque within the thoracic aorta.  Stable position of support apparatus. The lungs remain  hyperexpanded.  Grossly unchanged bibasilar heterogeneous opacities,  left greater than right. No new  focal airspace opacities. No pleural  effusion or pneumothorax. No evidence of edema. No acute osseus  abnormalities.     IMPRESSION:  Grossly unchanged bibasilar opacities, left greater than right,  likely atelectasis. No definite evidence of aspiration on this AP  portable examination. Further evaluation with a PA and lateral chest  radiograph may be obtained as clinically indicated.      Electronically Signed    By: Sandi Mariscal M.D.    On: 05/22/2014 20:53         Verified By: Aileen Fass, M.D.,    24-Aug-15 12:51, Chest Portable Single View  Chest Portable Single View  REASON FOR EXAM:    Chest Pain  COMMENTS:       PROCEDURE: DXR - DXR PORTABLE CHEST SINGLE VIEW  - Jun 11 2014 12:51PM     CLINICAL DATA:  Chest pain    EXAM:  PORTABLE CHEST - 1 VIEW    COMPARISON:  Portable exam 1248 hr compared 05/22/2014    FINDINGS:  RIGHT jugular central venous catheter stable tip projecting over  cavoatrial junction region.  Enlargement of cardiac silhouette with pulmonary vascular  congestion.    Atherosclerotic calcification aorta.    Chronic bronchitic changes with bibasilar atelectasis.    No definite infiltrate, pleural effusion or pneumothorax.    Bones demineralized.     IMPRESSION:  Enlargement of cardiac silhouette with pulmonary vascular  congestion.  Bibasilar atelectasis and mild chronic bronchitic changes.      Electronically Signed    By: Lavonia Dana M.D.    On: 06/11/2014 13:24         Verified By: Burnetta Sabin, M.D.,  [place]:    24-Aug-15 17:15, US Carotid Doppler Bilateral  US Carotid Doppler Bilateral  REASON FOR EXAM:    syncope  COMMENTS:       PROCEDURE: [place]  - US CAROTID DOPPLER BILATERAL  - Jun 11 2014  5:15PM     CLINICAL DATA:  Syncopal episode. History of hypertension, TIA and  diabetes.    EXAM:  BILATERAL CAROTID DUPLEX ULTRASOUND    TECHNIQUE:  Pearline Cables scale imaging, color Doppler and duplex ultrasound were  performed of  bilateral carotid and vertebral arteries in the neck.  COMPARISON:  Head CT - 10/02/2012    FINDINGS:  The examination is degraded due to patient body habitus.    Criteria: Quantification of carotid stenosis is based on velocity  parameters that correlate the residual internal carotid diameter  with NASCET-based stenosis levels, using the diameter of the distal  internal carotid lumen as the denominator for stenosis measurement.    The following velocity measurements were obtained:    RIGHT    ICA:  133/7 cm/sec  CCA:  762/2 cm/sec    SYSTOLIC ICA/CCA RATIO:  1.3    DIASTOLIC ICA/CCA RATIO:  1.9    ECA:  230 cm/sec    LEFT    ICA:  138/31 cm/sec    CCA:  633/35 cm/sec    SYSTOLIC ICA/CCA RATIO:  1.3  DIASTOLIC ICA/CCA RATIO:  2.4    ECA:  72 cm/sec    RIGHT CAROTID ARTERY: There is a moderate to large amount of  eccentric echogenic plaque within the right carotid bulb (image 18),  extending to involve the origin and proximal aspect of the right  internal carotid artery (image 26), resulting in elevated peak  systolic velocities throughout  the right internal carotid artery  (greatest at its mid aspect measuring 133 cm/sec - image 30).    RIGHT VERTEBRAL ARTERY:  Antegrade flow    LEFT CAROTID ARTERY: The right common carotid artery is noted to be  mildly tortuous. There is a moderate amount of eccentric mixed  echogenic plaque within the left carotid bulb (image 44), extending  to involve the origin and proximal aspect of the left internal  carotid artery (image 55), not result resulting in elevated peak  systolic velocities throughout the left internal carotid artery  (greatest proximally measuring 138 cm/sec - image 57).    LEFT VERTEBRAL ARTERY: Antegrade flow     IMPRESSION:  Moderate to large amount of bilateral atherosclerotic plaque, right  subjectively greater than left, resulting in elevated peak systolic  velocities within the bilateral internal  carotid arteries compatible  with the 50-69% luminal narrowing range bilaterally. Further  evaluation could be performed with CTA as clinically indicated.  Electronically Signed    By: Sandi Mariscal M.D.    On: 06/11/2014 17:24         Verified By: Aileen Fass, M.D.,  [place]:    05-Mar-15 10:26, Digital Screen Mammogram  PACS Image    26-Jul-15 15:55, Chest Portable Single View  PACS Image    28-Jul-15 10:03, Chest Portable Single View  PACS Image    01-Aug-15 07:20, Chest PA and Lateral  PACS Image    04-Aug-15 20:34, Chest Portable Single View  PACS Image    24-Aug-15 12:51, Chest Portable Single View  PACS Image    24-Aug-15 17:15, US Carotid Doppler Bilateral  PACS [place]:    05-Mar-15 10:26, Digital Screen Mammogram  Digital Screen Mammogram  REASON FOR EXAM:    scr mammo no order  COMMENTS:       PROCEDURE: MAM - MAM DGTL SCREENING MAMMO W/CAD  - Dec 21 2013 10:26AM     CLINICAL DATA:  Screening.    EXAM:  DIGITAL SCREENING BILATERAL MAMMOGRAM WITH CAD    COMPARISON:  Previous exam(s).    ACR Breast Density Category b: There are scattered areas of  fibroglandular density.  FINDINGS:  There are no findings suspicious for malignancy. Images were  processed with CAD.     IMPRESSION:  No mammographic evidence of malignancy. A result letter of this  screening mammogram will be mailed directly to the patient.    RECOMMENDATION:  Screening mammogram in one year. (Code:SM-B-01Y)    BI-RADS CATEGORY  1: Negative.      Electronically Signed    By: Hassan Rowan M.D.    On: 12/21/2013 16:25         Verified By: Lura Em, M.D.,   ASSESSMENT AND PLAN:  Assessment/Admission Diagnosis syncope versus cardiac arrest.  No obvious residual deficits Moderate carotid artery stenosis bilaterally ESRD Other medical issues as above   Plan Has had an extensive work up with multiple studies.  These included a carotid duplex that suggests moderate  ICA stenosis bilaterally.  In review of the images and the velocities, the degree of stenosis is likely on the low end of 50-60% versus the higher end of 1-49%.    This was very unlikely the cause of her symptoms and is well below the threshold for consideration for repair.  I will plan to see her back in the office and this would generally be treated with ASA and a statin agent, and followed on 6 month intervals  with duplex.  No other in house recs     level 4   Electronic Signatures: Algernon Huxley (MD)  (Signed 26-Aug-15 14:01)  Authored: Chief Complaint and History, PAST MEDICAL/SURGICAL HISTORY, ALLERGIES, HOME MEDICATIONS, Family and Social History, Review of Systems, Physical Exam, LABS, RADIOLOGY, Assessment and Plan   Last Updated: 26-Aug-15 14:01 by Algernon Huxley (MD)

## 2015-02-09 NOTE — Consult Note (Signed)
 General Aspect Tunneled dialysis catheter infection   Present Illness The patient is a 73-year-old female with end-stage dialysis ( HD on Monday, Wednesday, Friday) via a tunneled catheter in her right chest, fistula in the left arm has not matured.  She also has dementia, stroke, hypertension, and diabetes. She has also been battling a urinary tract infection and has been treated with vancomycin for enterococcus. She was sent in from her facility for a fever, altered mental status. Hospitalist services were contacted for further evaluation. In the ER, her initial temperature was 101.2. blood pressure 106/56, pulse oximetry 79% on room air, respirations 24%, pulse 78. Urinalysis was positive for 3+ leukocyte esterase. Troponin was elevated at 14, hemoglobin 7.4. There is concern for catheter related infection as well.  The patient is unable to give meaningful history.     PAST MEDICAL HISTORY: End-stage renal disease on dialysis Monday, Wednesday, Friday;  hypertension, diabetes, stroke, dementia, UTIs.   PAST SURGICAL HISTORY: Dialysis fistula, left arm catheter right chest; right knee replacement, cholecystectomy, hysterectomy.   Home Medications: Medication Instructions Status  clonidine 0.2 mg oral tablet 1 tab(s) orally 2 times a day Active  pantoprazole 40 mg oral delayed release tablet 1 tab(s) orally once a day (in the morning) Active  Vitamin D3 2000 intl units oral tablet 1 tab(s) orally once a day Active  Plavix 75 mg oral tablet 1 tab(s) orally once a day Active  HumaLOG KwikPen 100 units/mL subcutaneous solution  subcutaneous 4 times a day (before meals and at bedtime), As Needed per sliding scale Active  F[place]ousal 325 mg oral tablet 1 tab(s) orally once a day Active  Levemir FlexPen 100 units/mL subcutaneous solution 7 unit(s) subcutaneous once a day (at bedtime) Active  pravastatin 20 mg oral tablet 1 tab(s) orally once a day (at bedtime) Active  docusate sodium 100 mg oral capsule  1 cap(s) orally every 12 hours, As Needed - for Constipation Active  nitroglycerin 0.4 mg sublingual tablet 1 tab(s) sublingual every 5 minutes, As Needed - for Chest Pain Active  Percocet 5/325 325 mg-5 mg oral tablet 2 tab(s) orally every 4 hours, As Needed - for Pain Active  Senna - oral tablet 1 tab(s) orally every 12 hours, As Needed - for Constipation Active  Neurontin 300 mg oral capsule 1 cap(s) orally 2 times a day Active  losartan 100 mg oral tablet 1 tab(s) orally once a day Active  amLODIPine 5 mg oral tablet 1 tab(s) orally once a day Active  Zofran 4 mg oral tablet 1 tab(s) orally every 8 hours, As Needed - for Nausea, Vomiting Active  labetalol 100 mg oral tablet 1 tab(s) orally 2 times a day Active    Phenergan: Alt Ment Status, Swelling  Ace Inhibitors: Cough  Altace: Cough  Cipro: Rash  Penicillin: Itching, Swelling  Amoxicillin: Itching, Swelling  Case History:  Family History Non-Contributory   Social History negative tobacco, negative ETOH, negative Illicit drugs   Review of Systems:  ROS Pt not able to provide ROS  confused   Physical Exam:  GEN well developed, no acute distress   HEENT hearing intact to voice, moist oral mucosa   NECK supple  trachea midline   RESP normal resp effort  no use of accessory muscles   CARD regular rate  no JVD   VASCULAR ACCESS AV fistula present  Good bruit  Good thrill  appears to be a stricture about 3 cm from anastomosis   ABD denies tenderness    soft   EXTR negative cyanosis/clubbing, negative edema   SKIN normal to palpation, No rashes   NEURO cranial nerves intact, follows commands   PSYCH alert, poor insight   Nursing/Ancillary Notes: **Vital Signs.:   23-Sep-15 10:42  Vital Signs Type Admission  Temperature Temperature (F) 98.6  Celsius 37  Temperature Source oral  Pulse Pulse 74  Respirations Respirations 18  Systolic BP Systolic BP 782  Diastolic BP (mmHg) Diastolic BP (mmHg) 66  Mean BP 92   Pulse Ox % Pulse Ox % 96  Pulse Ox Activity Level  At rest  Oxygen Delivery 4L   Hepatic:  23-Sep-15 07:13   Bilirubin, Total 0.5  Alkaline Phosphatase 106 (46-116 NOTE: New Reference Range 05/08/14)  SGPT (ALT) 33 (14-63 NOTE: New Reference Range 05/08/14)  SGOT (AST)  126  Total Protein, Serum 6.7  Albumin, Serum  2.1  Routine Micro:  23-Sep-15 07:13   Micro Text Report BLOOD CULTURE   COMMENT                   NO GROWTH IN 8-12 HOURS   ANTIBIOTIC                       Micro Text Report BLOOD CULTURE   COMMENT                   NO GROWTH IN 8-12 HOURS   ANTIBIOTIC                       Culture Comment NO GROWTH IN 8-12 HOURS  Result(s) reported on 11 Jul 2014 at 03:00PM.  Culture Comment NO GROWTH IN 8-12 HOURS  Result(s) reported on 11 Jul 2014 at 03:00PM.    11:12   Micro Text Report INFLUENZA A+B ANTIGENS   COMMENT                   NEGATIVE FOR INFLUENZA A (ANTIGEN ABSENT)   COMMENT                   NEGATIVE FOR INFLUENZA B (ANTIGEN ABSENT)   ANTIBIOTIC                       Specimen Source NOSE SWAB  Comment 1.. NEGATIVE FOR INFLUENZA A (ANTIGEN ABSENT) A negative result does not exclude influenza. Correlation with clinical impression is required.  Comment 2.. NEGATIVE FOR INFLUENZA B (ANTIGEN ABSENT)  Result(s) reported on 11 Jul 2014 at 11:51AM.  Lab:  23-Sep-15 07:15   Lactic Acid, Arterial, Cardiopulmonary  1.0 (0.3-0.8 NOTE: New Reference Range 05/29/14)  Routine Chem:  23-Sep-15 07:13   Result Comment TROPONIN - RESULTS VERIFIED BY REPEAT TESTING.  - NOTIFIED DENIA ROYSTER ON 07/11/14 @ 0811  - WITH ELEVATED RESULTS...qsd  - READ-BACK PROCESS PERFORMED.  Result(s) reported on 11 Jul 2014 at 07:40AM.  Glucose, Serum  103  BUN  19  Creatinine (comp)  4.67  Sodium, Serum 137  Potassium, Serum  3.1  Chloride, Serum  96  CO2, Serum  34  Calcium (Total), Serum  7.9  Osmolality (calc) 276  eGFR (African American)  10  eGFR (Non-African  American)  9 (eGFR values <42m/min/1.73 m2 may be an indication of chronic kidney disease (CKD). Calculated eGFR is useful in patients with stable renal function. The eGFR calculation will not be reliable in acutely ill patients when serum creatinine is changing rapidly. It is  not useful in  patients on dialysis. The eGFR calculation may not be applicable to patients at the low and high extremes of body sizes, pregnant women, and vegetarians.)  Anion Gap 7  Magnesium, Serum  1.6 (1.8-2.4 THERAPEUTIC RANGE: 4-7 mg/dL TOXIC: > 10 mg/dL  -----------------------)  Phosphorus, Serum 4.5 (Result(s) reported on 11 Jul 2014 at 08:17AM.)    16:34   Result Comment TROPONIN - RESULTS VERIFIED BY REPEAT TESTING.  - PREVIOUSLY CALLED BY QSD/0811;07-11-24/SW  Result(s) reported on 11 Jul 2014 at 05:38PM.  Cardiac:  23-Sep-15 07:13   Troponin I  14.00 (0.00-0.05 0.05 ng/mL or less: NEGATIVE  Repeat testing in 3-6 hrs  if clinically indicated. >0.05 ng/mL: POTENTIAL  MYOCARDIAL INJURY. Repeat  testing in 3-6 hrs if  clinically indicated. NOTE: An increase or decrease  of 30% or more on serial  testing suggests a  clinically important change)  CPK-MB, Serum  7.4 (Result(s) reported on 11 Jul 2014 at 08:45AM.)    11:12   CPK-MB, Serum  7.1 (Result(s) reported on 11 Jul 2014 at 11:36AM.)    16:34   Troponin I  9.64 (0.00-0.05 0.05 ng/mL or less: NEGATIVE  Repeat testing in 3-6 hrs  if clinically indicated. >0.05 ng/mL: POTENTIAL  MYOCARDIAL INJURY. Repeat  testing in 3-6 hrs if  clinically indicated. NOTE: An increase or decrease  of 30% or more on serial  testing suggests a  clinically important change)  CPK-MB, Serum  7.6 (Result(s) reported on 11 Jul 2014 at 05:39PM.)  Routine UA:  23-Sep-15 07:13   Color (UA) Yellow  Clarity (UA) Turbid  Glucose (UA) Negative  Bilirubin (UA) Negative  Ketones (UA) Negative  Specific Gravity (UA) 1.005  Blood (UA) 1+  pH (UA) 5.0  Protein  (UA) 30 mg/dL  Nitrite (UA) Negative  Leukocyte Esterase (UA) 3+ (Result(s) reported on 11 Jul 2014 at 08:20AM.)  RBC (UA) 421 /HPF  WBC (UA) 61518 /HPF  Bacteria (UA) NONE SEEN  Epithelial Cells (UA) NONE SEEN  WBC Clump (UA) PRESENT (Result(s) reported on 11 Jul 2014 at 08:20AM.)  Routine Coag:  23-Sep-15 07:13   Prothrombin 14.4  INR 1.1 (INR reference interval applies to patients on anticoagulant therapy. A single INR therapeutic range for coumarins is not optimal for all indications; however, the suggested range for most indications is 2.0 - 3.0. Exceptions to the INR Reference Range may include: Prosthetic heart valves, acute myocardial infarction, prevention of myocardial infarction, and combinations of aspirin and anticoagulant. The need for a higher or lower target INR must be assessed individually. Reference: The Pharmacology and Management of the Vitamin K  antagonists: the seventh ACCP Conference on Antithrombotic and Thrombolytic Therapy. Chest.2004 Sept:126 (3suppl): 2045-2335. A HCT value >55% may artifactually increase the PT.  In one study,  the increase was an average of 25%. Reference:  "Effect on Routine and Special Coagulation Testing Values of Citrate Anticoagulant Adjustment in Patients with High HCT Values." American Journal of Clinical Pathology 2006;126:400-405.)  Routine Hem:  23-Sep-15 07:13   WBC (CBC)  15.0  RBC (CBC)  2.40  Hemoglobin (CBC)  7.4  Hematocrit (CBC)  23.3  Platelet Count (CBC) 265  MCV 97  MCH 31.0  MCHC 32.0  RDW  14.7  Neutrophil % 83.2  Lymphocyte % 6.8  Monocyte % 9.6  Eosinophil % 0.0  Basophil % 0.4  Neutrophil #  12.4  Lymphocyte # 1.0  Monocyte #  1.4  Eosinophil # 0.0  Basophil # 0.1 (Result(s) reported   on 11 Jul 2014 at 07:31AM.)   XRay:    23-Sep-15 07:30, Chest PA and Lateral  Chest PA and Lateral   REASON FOR EXAM:    cough, fever  COMMENTS:       PROCEDURE: DXR - DXR CHEST PA (OR AP) AND LATERAL  - Jul 11 2014  7:30AM     CLINICAL DATA:  Cough, fever    EXAM:  CHEST  2 VIEW    COMPARISON:  06/20/2014    FINDINGS:  Cardiomediastinal silhouette is stable. Atherosclerotic  calcifications of thoracic aorta. Stable double lumen right IJ  catheter position. Central mild vascular congestion without  convincing pulmonary edema. There is small left pleural effusion  with left basilar atelectasis orinfiltrate.     IMPRESSION:  Small left pleural effusion with left basilar atelectasis or  infiltrate. Central mild vascular congestion without convincing  pulmonary edema.      Electronically Signed    By: Lahoma Crocker M.D.    On: 07/11/2014 07:34       Verified By: Ephraim Hamburger, M.D.,    Impression 1.  Clinical sepsis:  The source of her infection is not easily identified and could be the tunneled catheter, her UTI or a pneumaonia.  I have had a long discussion with the son 30 minutes, he is considering stopping dialysis and has initiated discussion with palliative care.  If this is the chosen path then I do not think the catheter needs to be removed.  Also discusssed was the fistula which will need a fistulagram and intervention to be useful for dialysis.  At this time I will wait for the descision regarding dialysis to be finalized and then act accordingly. 2.  Acute respiratory failure. Pulse oximetry 79% on room air. Currently, okay on 3 liters. We will give oxygen supplementation.  3.  Acute myocardial infarction, likely with sepsis. I will add aspirin. The patient is already on Plavix, statin, and labetalol. I spoke with Dr. Ubaldo Glassing, cardiology, to decide on heparin drip or not. The patient will be admitted to telemetry. Serial cardiac enzymes.  4.  Anemia. Looking back at old hemoglobins, the patient has been anemic in the past, likely anemia of chronic disease.  5.  Hypertension. Blood pressure on the lower side. We will continue the labetalol and hold all other medications.  6.  Dementia.   7.  History of cerebrovascular accident on Plavix.  8. Hypomagnesemia: We will replace magnesium IV   Plan Level 4 consult   Electronic Signatures: Hortencia Pilar (MD)  (Signed 23-Sep-15 18:44)  Authored: General Aspect/Present Illness, Home Medications, Allergies, History and Physical Exam, Vital Signs, Labs, Radiology, Impression/Plan   Last Updated: 23-Sep-15 18:44 by Hortencia Pilar (MD)

## 2015-02-09 NOTE — Discharge Summary (Signed)
PATIENT NAME:  Alexis Joseph, [name] MR#:  [id number] DATE OF BIRTH:  1941/04/03  DATE OF ADMISSION:  07/11/2014 DATE OF DISCHARGE:  07/12/2014  PRIMARY CARE PHYSICIAN: Teena Iraniavid M. Terance HartBronstein, MD.   FINAL DIAGNOSES: 1.  Sepsis.  2.  Acute respiratory failure.  3.  Acute myocardial infarction.  4.  End-stage renal disease, on dialysis.  5.  Dementia.  6.  CVA.  7.  Hypertension.   MEDICATIONS ON DISCHARGE TO THE HOSPICE HOME: Include nitroglycerin 0.4 mg 1 tablet every 5 minutes as needed for chest pain, Zofran ODT 4 mg every 6 hours as needed for nausea and vomiting, lorazepam 0.5 mg 1 to 2 tablets orally sublingually every 2 to 4 hours as needed for agitation and anxiety, morphine 20 mg/mL 0.25 mL every 1 to 2 hours as needed. Stop taking all other medications.   HOSPITAL COURSE: The patient was admitted 07/11/2014, discharged 07/12/2014. Came in with a fever, found to be in acute respiratory failure. Pulse oximetry 79% on room air, respirations 24, clinical sepsis, possible UTI, possible catheter infection. Chest x-ray probably over-read as pneumonia, but I did start aggressive antibiotics of vancomycin, aztreonam, and Zithromax. Oxygen supplementation was given for the acute respiratory failure. For the acute myocardial infarction, the patient was already on Plavix, statin, and labetalol. I added aspirin. Anemia is chronic.   CONSULTATIONS DURING THE HOSPITAL COURSE: Included Dr. Thedore MinsSingh nephrology, Dr. Gilda CreaseSchnier vascular surgery, Dr. Latrelle DodrillFast cardiology, Dr. Harvie JuniorPhifer palliative care.   LABORATORY AND RADIOLOGICAL DATA: Included a urine culture that grew out 100,000 gram-negative rods, urinalysis 3+. INR 1.1. Troponin 14. Magnesium 1.6, glucose 103, BUN 19, creatinine 4.67, potassium 3.1, sodium 137, CO2 of 34, chloride 96, calcium 7.9. Liver function tests: Albumin low at 2.1, AST elevated at 126. White blood cell count 15.0, hemoglobin 7.4, platelet count of 265,000. Blood cultures negative. Chest x-ray  showed small left pleural effusion, left basilar atelectasis or infiltrate. Flu swab negative. Troponin trended down to 8.9. A repeat hemoglobin still 7.3. White count 13.8.   HOSPITAL COURSE: The patient initially was started on aggressive antibiotics. Son thought about stopping dialysis since the patient is having a hard time with the dialysis. The patient keeps on repeatedly getting infections. Decision was made to stop dialysis, therefore, making the patient a hospice home candidate. The patient will be transferred to the hospice home today. Antibiotics and medications were stopped. Comfort care measures involved.   TIME SPENT ON PATIENT CARE TODAY: 35 minutes.    ____________________________ [name], MD rjw:at D: 07/12/2014 12:59:56 ET T: 07/12/2014 14:04:32 ET JOB#: [id number]  cc: [name], MD, <Dictator> Teena Iraniavid M. Terance HartBronstein, MD Salley ScarletICHARD J Ely Ballen MD ELECTRONICALLY SIGNED 08/05/2014 12:45

## 2015-02-09 NOTE — Consult Note (Signed)
   Present Illness Pt is a 74 yo female resident of [facility] who was admitted after becoming unresponsive after hemodialysis. She was felt to have had a caradiac arrest and recieved cpr. Records form this event are not currently available. She does not recall anything of the event. She was brought to the er where she was in sinus rhythm. She has a mild serum troponoin elevation. She complains of chest wall pain. She denies sob or previous syncopal episodes. She has had no arrythmia since admission.   Physical Exam:  GEN well nourished, no acute distress   HEENT PERRL   NECK supple  No masses   RESP clear BS   CARD Regular rate and rhythm   ABD denies tenderness  no Adominal Mass   LYMPH negative neck, negative axillae   EXTR negative cyanosis/clubbing   SKIN normal to palpation   NEURO cranial nerves intact, motor/sensory function intact   PSYCH A+O to time, place, person   Review of Systems:  Subjective/Chief Complaint chest wall pain   General: No Complaints   Skin: No Complaints   ENT: No Complaints  Decreased hearing   Eyes: No Complaints   Neck: No Complaints   Respiratory: No Complaints   Cardiovascular: Chest pain or discomfort   Gastrointestinal: No Complaints   Genitourinary: No Complaints   Vascular: No Complaints   Musculoskeletal: No Complaints   Neurologic: No Complaints   Hematologic: No Complaints   Endocrine: No Complaints   Psychiatric: No Complaints   Review of Systems: All other systems were reviewed and found to be negative   Medications/Allergies Reviewed Medications/Allergies reviewed   Family & Social History:  Family and Social History:  Family History Non-Contributory   Place of Living Nursing Home   EKG:  EKG NSR   Abnormal NSSTTW changes    Phenergan: Alt Ment Status, Swelling  Ace Inhibitors: Cough  Altace: Cough  Cipro: Rash  Penicillin: Itching, Swelling  Amoxicillin: Itching,  Swelling   Impression 74 yo female with history of esrd on hemodialysis who was admitted after becoming unresponsive during or immediatley post dialysis. She received brief cpr and was brought to the er. EXR showed mild pulmoonary edema. She had mild troponin elevation but had recieved cpr and was unreposneive for several minutes. Details of the event aare not avaialbe. Pt may have had an arrythmia but no rhythm issues since admission and ef is normal by echo. Carotid dopplers revealed a moderate plaque bilaterally with less than 69% stenosis. She may have been transiently hypotensive post dialysis which was symptonmatic due to relative caarotid disease. She does not appear to have ruled in for an mi. She is currently stable.   Plan 1. Continue to follow on telemetry 2. HD as needed 3. Repleat K 4. Ambulate and follow symptoms and hemodynamics 5. Consider holter monitor placement at discharge 6. Consider vascular outpatinet evaluation 7. Ambulate and consdier discharge in am if stable.  8. Will follow as out patient.   Electronic Signatures: Dalia HeadingFath, Nelida Mandarino A (MD)  (Signed 25-Aug-15 20:30)  Authored: General Aspect/Present Illness, History and Physical Exam, Review of System, Family & Social History, EKG , Allergies, Impression/Plan   Last Updated: 25-Aug-15 20:30 by Dalia HeadingFath, Andelyn Spade A (MD)

## 2015-02-09 NOTE — Consult Note (Signed)
PATIENT NAME:  Alexis Joseph, Alexis Joseph MR#:  [id number] DATE OF BIRTH:  28-Sep-1941  DATE OF CONSULTATION:  05/24/2014  REFERRING PHYSICIAN:   CONSULTING PHYSICIAN:  Stann Mainland. Sampson Goon, MD  REQUESTING PHYSICIAN: Dr. Allena Katz.   REASON FOR CONSULTATION: Urinary tract infection and sepsis.   HISTORY OF PRESENT ILLNESS: This is a pleasant 74 year old female, who was discharged July 31 after hospitalization for altered mental status and initiation of hemodialysis. This was complicated by some uremia as well as by malfunctioning of her AV fistula. She ended up having a right permanent catheter placed. She was discharged home but then readmitted within 1 day with some increasing confusion. She was seen by renal who has continued her on dialysis. Her mental status has improved some since readmission. On admission, her white count was 12.8. Her catheterization of her urine showed grossly purulent urine at that time.   PAST MEDICAL HISTORY: Hypertension, diabetes, CVA, end-stage renal disease, just started on dialysis.   SOCIAL HISTORY: No tobacco use, prior alcohol use. She was residing at [facility] after her recent admission. Her son is her power of attorney.    FAMILY HISTORY: Noncontributory.   ALLERGIES: ACE INHIBITORS, ALTACE, AMOXICILLIN, CIPROFLOXACIN, PENICILLIN, AND PHENERGAN.   REVIEW OF SYSTEMS: Eleven systems reviewed and negative except as per HPI.   ANTIBIOTICS SINCE ADMISSION: Include vancomycin, aztreonam, and recently started on linezolid.   PHYSICAL EXAMINATION:  VITAL SIGNS: Temperature 98.3, pulse 70, blood pressure 188/76, respirations 18, saturation 97% on room air.  GENERAL: She is pleasant, somewhat confused, but interactive, knows where she, knows Susa Loffler is president.  HEENT: Pupils equal, round, and reactive to light and accommodation. Extraocular movements are intact. Sclerae anicteric. Oropharynx clear.  NECK: Supple.  HEART: Regular with a 1/6 systolic murmur.   ABDOMEN: Soft, nontender, nondistended. No hepatosplenomegaly.  LUNGS: Clear to auscultation.  EXTREMITIES: Left upper arm, she has an AV graft with a lot of bruising around it, but a good thrill. She has a right chest hemodialysis catheter.  NEUROLOGIC: She is alert, interactive, but somewhat slow to respond.   DATA: Urinalysis does not appear to have been done but her urine culture is growing greater than 100,000 enterococcus sensitive to nitrofurantoin, ampicillin, ciprofloxacin, linezolid and levofloxacin. Blood cultures x 2 are negative. White blood count on admission was 12.8, hemoglobin 9.3, platelets 218. Renal function is consistent with end-stage renal disease. LFTs on admission were relatively normal except albumin low at 2.3. Chest x-ray on August 4 showed unchanged bibasilar opacities.   IMPRESSION: A 74 year old with end-stage renal disease, recently initiated on hemodialysis, as well as hypertension and diabetes. She was just discharged after a hospital stay where she initiated dialysis. She was readmitted with altered mental status and had purulent urine on urine catheterization. She has not had a urinalysis done, but she does have greater than 100,000 enterococcus in her urine culture. SHE HAS MULTIPLE DRUG ALLERGIES INCLUDING TO PENICILLIN AND CIPROFLOXACIN. SHE REPORTS THE CIPROFLOXACIN ALLERGY WAS SWELLING AND RASH. She has clinically improved and appears to be at her baseline mental status.   RECOMMENDATIONS:  1.  Recheck a UA and urine culture.  2.  Since SHE ALLERGIC TO PENICILLIN AND CIPROFLOXACIN, and the organism is resistant to doxycycline, and Macrobid will not work in end-stage renal disease, I think the best course would be to give her IV vancomycin, which she can get at dialysis. I would recommend a 10 day course given that she was quite ill with this and had purulence  noted on her catheterization.  3.  I would not recommend using linezolid at this point, although that  would be an option. The vancomycin can be given at dialysis.  4. Thank you for the consult. I will be glad to follow with you.    ____________________________ Stann Mainlandavid P. Sampson GoonFitzgerald, MD dpf:tm D: 05/24/2014 17:17:04 ET T: 05/24/2014 17:37:20 ET JOB#: [id number]  cc: Stann Mainlandavid P. Sampson GoonFitzgerald, MD, <Dictator> Haeven Nickle Sampson GoonFITZGERALD MD ELECTRONICALLY SIGNED 05/25/2014 22:09

## 2015-02-09 NOTE — H&P (Signed)
PATIENT NAME:  Alexis Joseph, Alexis Joseph MR#:  [id number] DATE OF BIRTH:  [birth year]  PRIMARY CARE PHYSICIAN:  Stann Mainland. Sampson Goon, MD  HISTORY OF PRESENT ILLNESS:  The patient is a 74 year old Caucasian female with past medical history significant for history of end-stage renal disease, who was in the hemodialysis center when she apparently had a cardiac arrest.  There is a discrepancy in medical records and I am not able to find out this from patient since patient does not remember anything what happened, in fact, at home and vaguely remembers hemodialysis at all.  She does not remember much about event, but apparently, according to triage, patient arrived by EMS from [facility] where during the first 14 minutes, she became pulseless as well as unresponsive.  She got CPR initiated and she was revived.  On arrival to the Emergency Room, she was alert and oriented. However, not able to recall her incident.  Upon further questioning, including Emergency Room physician, Dr. Mayford Knife, he feels that patient may have had hemodialysis completed since it is documented that she tolerated hemodialysis well and her weight decreased by 1.4 kg.  The patient, however, does not remember anything about her event.   PAST MEDICAL HISTORY: Significant for history of admission in August 2015 for what looks like SIRS with urinary tract infection, Enterococcus faecalis, was initiated on vancomycin and was first to continue vancomycin for 14 total of days.  Blood cultures during this admission were negative.  Past medical history also significant for history of stroke with memory difficulties, also end-stage renal disease, hypertension, diabetes mellitus, on insulin therapy. Recent permanent catheter placement and AV fistula problems and admission for the same at the end of July, beginning of August.   ALLERGIES: ACE INHIBITOR, ALTACE, AMOXICILLIN, AS WELL AS CIPROFLOXACIN.   FAMILY HISTORY: Unable to obtain as the patient  does not remember much about her family.   SOCIAL HISTORY: Also not available.   MEDICATIONS: According to medical records, the patient is on clonidine 0.2 mg twice daily, Cozaar 50 mg p.o. daily, Fer-In-Sol 325 mg once daily, sliding scale Humalog 4 times daily as needed, Levemir 7 units subcutaneously at bedtime, Neurontin 300 mg p.o. twice daily, pantoprazole 40 mg p.o. daily, Plavix 75 mg p.o. daily, atorvastatin 10 mg p.o. daily, vitamin D3, 2000 units once daily, Zofran 4 mg every 8 hours as needed.   REVIEW OF SYSTEMS: CONSTITUTIONAL:  Difficult to obtain as the patient does not remember anything of the  event.  Admits to nausea, however.  Admits to using walker to ambulate.  Admits to making approximately a half cup of urine daily. Denies any fever or chills, fatigue, weakness, pains, weight loss or gain.  EYES: Denies any blurry vision, double vision, or glaucoma, cataracts. ENT: Denies any tinnitus, allergies, epistaxis, sinus pain, dentures, difficulty swallowing. RESPIRATORY: Denies any wheezes, asthma, COPD.  CARDIOVASCULAR: Denies chest pains, orthopnea, edema, arrhythmias, palpitations, or syncope. GASTROINTESTINAL: Denies any vomiting, no diarrhea or constipation.  GENITOURINARY: Denies dysuria, hematuria, or frequency. ENDOCRINOLOGY: Denies any polydipsia, nocturia, thyroid problems, heat or cold intolerance, or thirst.  HEMATOLOGIC: Denies anemia, easy bruising, or bleeding, swollen glands.  SKIN: Denies acne, rash, lesions, or change in moles.  MUSCULOSKELETAL: Denies arthritis, cramps, or swelling. NEUROLOGIC: Denies numbness, epilepsy, or tremor.  PSYCHIATRIC: Denies anxiety, insomnia, or depression.  PHYSICAL EXAMINATION:  VITAL SIGNS: On arrival to the hospital the patient's temperature was 98.3, pulse was 85, respiratory rate was 18, blood pressure 204/68, saturation was 94% on  oxygen.  GENERAL: The patient is a well-developed, well-nourished, Caucasian female in no  significant distress lying on the stretcher.  HEENT: His pupils are equal, reactive to light. Extraocular muscles intact, no icterus or conjunctivitis. Has normal hearing. No pharyngeal erythema. Mucosa is dry. NECK: No masses. Supple, nontender. Thyroid is not enlarged. No adenopathy. No JVD or carotid bruits bilaterally. Full range of motion.  LUNGS: Clear to auscultation in all fields. No rales, rhonchi, diminished breath sounds, or wheezing. No labored inspiration, increased effort, dullness to percussion, or overt respiratory distress.  CARDIOVASCULAR: S1, S2 appreciated. The rhythm is regular. PMI not lateralized. Chest is nontender to palpation. Normal  peripheral pulses.  No lower extremity edema, calf tenderness, or cyanosis was noted. The patient does have AV fistula which was in left upper extremity.  Good pulses as well as thrill and bruit. The patient has PermCath which is in the right upper chest.   ABDOMEN: Soft, nontender. Bowel sounds are present. No hepatosplenomegaly or masses were noted.  RECTAL: Deferred.  MUSCLE STRENGTH: Able to move all extremities. No cyanosis, degenerative joint disease, or kyphosis. Gait was not tested.  SKIN: Did not reveal any rashes, lesions, erythema, nodularity, or induration. It was warm and dry to palpation. LYMPHATIC: No adenopathy in the cervical region.  NEUROLOGIC: The patient has some facial weakness on the right side.  Sensory difficult to obtain. No dysarthria or aphasia.  PSYCHIATRIC: The patient is alert, oriented to person and place, cooperative. Memory is very impaired, but no significant confusion, agitation, or depression was noted.   LABORATORY DATA: Glucose of 116, creatinine of 3.58, potassium 2.9, estimated GFR for non African American would be 12. Liver enzymes revealed albumin level of 2.5, alkaline phosphatase 205, AST 39. Troponin is normal, as well as CK-MB fraction, and CK total. White blood cell count is elevated to 12.4,  hemoglobin 9.5, platelet count 205,000. Coagulation panel unremarkable. The patient's ABGs were done on room air, pH was 7.45, pCO2 was 43, pO2 was 66, saturation was 94.9% with lactic acid level of 0.7.   RADIOLOGIC STUDIES: Chest x-ray, portable, single, on 06/11/2014 showed enlargement of cardiac silhouette with pulmonary vascular congestion, right basilar atelectasis, and mild chronic bronchitic changes according to radiologist.   ASSESSMENT AND PLAN: 1. Cardiac arrest versus syncope. Rule out due to hypotension, arrhythmias, seizures,  hypoglycemia, etc. Admit the patient to medical floor for observation. Cardiology consultation will be obtained as well as echocardiogram, carotid ultrasound. Will continue the patient on telemetry.  2.  Hypokalemia. Supplement p.o., get magnesium level.  3.  Leukocytosis, questionable infection. Get blood cultures. Initiate the patient on vancomycin for now. Get sputum cultures if possible.  4.  Elevated transaminases of unclear etiology at this time, questionable due to statin. The patient seems to be afebrile and does not have any significant symptoms intra-abdominally.  5.  Malignant hypertension, resume the patient's blood pressure medications.   TIME SPENT: Fifty minutes.   ____________________________ Katharina Caperima Analea Muller, MD rv:LT D: 06/11/2014 16:17:00 ET T: 06/11/2014 17:14:02 ET JOB#: [id number]  cc: Katharina Caperima Harrietta Incorvaia, MD, <Dictator> Stann Mainlandavid P. Sampson GoonFitzgerald, MD Katharina CaperIMA Mariene Dickerman MD ELECTRONICALLY SIGNED 07/16/2014 21:34

## 2015-02-09 NOTE — H&P (Signed)
PATIENT NAME:  Alexis Joseph, Alexis Joseph MR#:  [id number] DATE OF BIRTH:  Sep 30, 1941  DATE OF ADMISSION:  05/13/2014  PRIMARY CARE PHYSICIAN:  Alexis Mainlandavid P. Sampson GoonFitzgerald, MD  REFERRING PHYSICIAN:  Sheryl L. Mindi JunkerGottlieb, MD  CHIEF COMPLAINT:  Acute shortness of breath.  HISTORY OF PRESENT ILLNESS:  This 74 year old woman with stage 4 chronic kidney disease, HTN, DM presents with shortness of breath and swelling.  The patient is unable to answer questions due to acute respiratory failure, on BiPAP.  History is obtained from her son, Alexis Joseph, who reports that for the past 6 weeks she has been having mild shortness of breath and has been working with her primary care physician and nephrologist to adjust her dose of Lasix.  Recently, she has been doing very well.  Lasix had been titrated down and they were discussing stopping this medication.  Her son reports that over the past few days she has not adhered to the low sodium diet and has had at least 1 beer.  This morning, she awoke with nausea, increased swelling and acute shortness of breath.  EMS was called to the house and she presented to [facility].  She has had no fevers, chills, cough or chest pain.  PAST MEDICAL HISTORY:   1.  Stage 4 chronic kidney disease with fistula in place in anticipation of hemodialysis. 2.  Hypertension. 3.  Diabetes type 2, insulin dependent.   4.  History of cerebrovascular accident with no residual defects. 5.  Recurrent transient ischemic attacks. 6.  Polyneuropathy due to diabetes. 7.  Autonomic dysfunction with orthostatic hypotension. 8.  Recurrent urinary tract infections. 9.  Anemia of chronic disease. 10.  Chronic abdominal pain.    PAST SURGICAL HISTORY:   1.  Right knee replacement. 2.  Cholecystectomy. 3.  Total abdominal hysterectomy.     ALLERGIES: 1.  PHENERGAN CAUSES ALTERED MENTAL STATUS. 2.  ACE INHIBITORS CAUSE COUGH. 3.  PENICILLIN CAUSES ITCHING. 4.  CIPROFLOXACIN CAUSES RASH  AND SWELLING.  HOME MEDICATIONS: 1.  Vitamin D3 2000 units once a day. 2.  Vitamin C 500 mg twice a day. 3.  Pravastatin 20 mg daily. 4.  Pantoprazole 40 mg daily. 5.  Losartan 50 mg daily. 6.  Levemir 10 units once a day at bedtime. 7.  Humalog Quick Pen 6 units subcutaneously 3 times a day with meals as well as sliding scale. 8.  Gabapentin 300 mg twice a day. 9.  Clopidogrel 75 mg daily. 10.  Clonidine 0.2 mg 3 times a day. 11.  Amlodipine 5 mg once a day.  SOCIAL HISTORY:  The patient lives with her son, Alexis Joseph.  She does not smoke cigarettes.  She drinks alcohol only occasionally.    FAMILY HISTORY:  Unable to obtain due to the patient's respiratory status.  REVIEW OF SYSTEMS:  Unable to obtain due to the patient's respiratory status.  PHYSICAL EXAMINATION: VITAL SIGNS:  Pulse 54, respirations 20, blood pressure 179/69, oxygen saturation 98% on BiPAP. GENERAL:  The patient is on BiPAP.  She is alert.  She is in mild distress.   HEENT:  Pupils are constricted, reactive, equal. Conjunctivae are clear.  There is no nasal discharge or lesions.  Unable to examine the mouth due to BiPAP mask.   NECK:  Supple.  Trachea is midline.  There are no masses, no cervical lymphadenopathy.   PULMONARY:  She has diffuse crackles with shallow inspiration. CARDIOVASCULAR:  Regular rate and rhythm, no murmurs, rubs or  gallops.   ABDOMEN:  Abdomen is soft, nontender, bowel sounds are normal.  There is no hepatosplenomegaly.  No guarding, no rebound. MUSCULOSKELETAL:  The patient is able to move all extremities normally.  There no tenderness, no effusion.  Range of motion is normal.  Strength is equal bilaterally.   SKIN:  There is no rash, no lesion, no wounds.   NEUROLOGIC:  Cranial nerves II through XII are grossly intact.  The patient is unable to perform a complete neurological examination.  She is alert and responds to commands.  LABORATORY DATA:  Sodium 130, potassium 4.9, chloride 97,  bicarbonate 19, BUN 106, creatinine 7.11, glucose 224, calcium 8.2.  BNP is 13,913.  White blood cell count 14, hemoglobin 10, platelets 207, MCV is 99.  INR is 1.0.  PH is 7.3, pCO2 36, pO2 97.  Lactic acid 0.8.  Troponin 0.14.    IMAGING:  Portable chest x-ray shows cardiomegaly with mild pulmonary edema.    ASSESSMENT AND PLAN: 1.  Acute respiratory failure:  Currently on bilevel positive airway pressure and doing well with sats in the high 90s.  Respiratory failure like due to volume overload causing pulmonary edema secondary to end-stage renal disease.  Will continue with Lasix as she is diuresing well.  Nephrology has been consulted and will see her in the morning.  She will be admitted to the Critical Care Unit for continuation of bilevel positive airway pressure. 2.  Acute on chronic kidney disease:  Nephrology will see in am for possible hemodialysis if respiratory status does not improve.  Will continue with Lasix for diuresis.  Will hold losartan at this time.   3.  Hypertension:  Uncontrolled hypertension likely due to volume overload.  Continue with Lasix for diuresis.  Low sodium diet.  Continue amlodipine and clonidine. 4.  Elevated troponin:  This is likely due to renal failure.  There is no chest pain, there are no EKG changes.  Continue to cycle. 5.  Anemia of chronic disease:  Stable, continue to monitor.   6.  Diabetes mellitus:  Will place on sliding scale insulin as she is n.p.o. at this time due to bilevel positive airway pressure.   7.  Hyperlipidemia:  Will hold statin at this time as she is n.p.o. due to bilevel positive airway pressure.    Time spent on admission 45 minutes.  ____________________________ Alexis Joseph. Alexis Ridges, MD cpw:cs Joseph: 05/13/2014 17:41:04 ET T: 05/13/2014 18:02:22 ET JOB#: [id number]  cc: Alexis Evans P. Alexis Ridges, MD, <Dictator> Alexis Journey MD ELECTRONICALLY SIGNED 05/13/2014 20:40

## 2015-02-09 NOTE — H&P (Signed)
PATIENT NAME:  Alexis Joseph, Alexis Joseph MR#:  [id number] DATE OF BIRTH:  1941/04/14  DATE OF ADMISSION:  05/22/2014  REFERRING PHYSICIAN: Dr. York CeriseForbach  PRIMARY CARE PHYSICIAN: Dr. Sampson GoonFitzgerald.   CHIEF COMPLAINT: Weakness.   HISTORY OF PRESENT ILLNESS: A 74 year old Caucasian female with history of end-stage renal disease on dialysis, which she recently started receiving dialysis via PermCath placed six days ago, hypertension, diabetes, presenting with weakness. She was recently discharged from [facility], actually yesterday, with discharge diagnosis of end-stage renal disease, new onset hemodialysis. She is at [facility], where the nurses noted her to have worsening mental status, described mainly as more lethargic. The patient herself is unable to provide reliable information, given baseline confusion, though  per son patient more lethargic than usual.   REVIEW OF SYSTEMS: Unobtainable, given patient's baseline mental status.   PAST MEDICAL HISTORY: Hypertension, diabetes, history of CVA as well as end-stage renal disease, recently started on dialysis via PermCath placed about six days ago.   SOCIAL HISTORY: No tobacco use. History of alcohol use on occasion. Currently residing at [facility]; however, for the last day. Previously resided with her son, who is power of attorney.   FAMILY HISTORY: No documentation of cardiovascular or pulmonary disorders.   ALLERGIES: ACE INHIBITORS, ALTACE, AMOXICILLIN, CIPRO, PENICILLIN, PHENERGAN.   HOME MEDICATIONS: Losartan 50 mg p.o. daily, clonidine 0.2 mg p.o. b.i.Joseph., gabapentin  300 mg p.o. b.i.Joseph., Humalog subcutaneous 4 times daily sliding scale, Levemir 70 units subcutaneous at bedtime, pravastatin 20 mg p.o. daily, Plavix 75 mg p.o. daily, Feosol 325 mg p.o. daily, Renvela carbonate 800 mg 2 tablets p.o. 3 times daily, pantoprazole 40 mg p.o. daily, vitamin D3 2000 international units p.o. daily.   PHYSICAL EXAMINATION: VITAL  SIGNS: Temperature 98.3, heart rate 58, respirations 20, blood pressure 91/50, saturating 95% on room air. Weight 81.6 kg, BMI 30.9.  GENERAL: Weak-appearing Caucasian female, currently in no acute distress.  HEAD: Normocephalic, atraumatic.  EYES: Pupils equal, round, reactive to light. Extraocular muscles intact. No scleral icterus.  MOUTH: Dry mucosal membrane. Dentition intact. No abscess noted.  EARS, NOSE, AND THROAT: Clear, without exudates. No external lesions.  NECK: Supple. No thyromegaly. No nodules. No JVD.  PULMONARY: Clear to auscultation bilaterally without wheezes, rales, rhonchi. No use of accessory muscles. Good respiratory effort.  CHEST: Nontender to palpation.  CARDIOVASCULAR: S1, S2. Regular rate and rhythm. No murmurs, rubs or gallops. No edema. Pedal pulses 2+ bilaterally.  GASTROINTESTINAL: Soft, nontender, nondistended. No masses. Positive bowel sounds. No hepatosplenomegaly.  MUSCULOSKELETAL: No swelling, clubbing or edema. Range of motion full in all extremities.  NEUROLOGIC: Cranial nerves II through XII intact. No gross focal neurological deficits. Sensation is intact. Reflexes intact. SKIN: Gross ecchymosis over her left upper extremity fistula; however, good palpable thrill. She has PermCath placed over the right chest. Unsure when the dressing was most recently changed. However, placed about six days ago. Minimal erythema around the catheter site. Biopatch is in place but upside down. No further rashes or lesions.  PSYCHIATRIC: Mood and affect blunted. She is arousable to verbal stimuli as well as somewhat somnolent. She is awake, alert, oriented to herself. Her insight and judgment appear to be poor.   LABORATORY DATA: Sodium 135, potassium 2.9, chloride 94, bicarbonate 31, BUN 27, creatinine 4.69, glucose 194. Troponin 0.03. WBC 12.8, hemoglobin 9.3, platelets of 218. Chest x-ray performed: Unchanged bibasilar opacities, left greater than right, which is consistent  with atelectasis. Urinalysis pending at this time. However,  the straight catheter is grossly purulent.   ASSESSMENT AND PLAN: A 74 year old female with history of end-stage renal disease, recently started on dialysis, presenting with weakness.  1.  Sepsis, meeting septic criteria, respiratory rate, leukocytosis. Potential source urinary tract infection versus central line associated bloodstream infection present on admission. Panculture including blood and urine. Antibiotic coverage with vancomycin and aztreonam. Once again, urine was purulent on a straight catheterization. Follow up culture data, taper antibiotics as required. Intravenous fluid hydration. Keep mean arterial pressure greater than 65 with realization that she is in end-stage renal disease with dialysis, so will not volume overload.  2.  End-stage renal disease on dialysis, recently started. She is due for her next session tomorrow. We will consult nephrology for continuation of dialysis.  3.  Diabetes type 2. Insulin sliding scale, q.6 hour. Accu-Cheks with Lantus.  4.  Hypokalemia. Replaced in the Emergency Department.  5.  deep venous thrombosis prophylaxis with heparin subcutaneous.  CODE STATUS: The patient is full code.   TIME SPENT: 45 minutes.   ____________________________ Cletis Athens. Hower, MD dkh:cg Joseph: 05/22/2014 23:14:47 ET T: 05/23/2014 00:52:35 ET JOB#: [id number]  cc: Cletis Athens. Hower, MD, <Dictator> DAVID Synetta Shadow MD ELECTRONICALLY SIGNED 05/30/2014 20:27

## 2015-02-09 NOTE — Consult Note (Signed)
CHIEF COMPLAINT and HISTORY:  Subjective/Chief Complaint N/V, uremia, infiltration of AVF   History of Present Illness Patient admitted with uremia, started on HD but had infiltration of her AVF at dialysis yesterday.  Unable to use today.  We are asked to place Permcath.  She is agitated and provides poor history today.   PAST MEDICAL/SURGICAL HISTORY:  Past Medical History:   chronic kidney insuffiency:    hypertension:    TIA X 2:    Neuropathy:    Restless Leg Syndrome:    Urinary Tract Infection:    Anemia:    Diabetes Mellitus, Type II (NIDD):    Hypercholesterolemia:    Irritable Bowel Syndrome:    CVA/Stroke:    right total knee [id number]:    Lower dental implants:    Hysterectomy - Partial:   ALLERGIES:  Allergies:  Phenergan: Alt Ment Status, Swelling  Ace Inhibitors: Cough  Altace: Cough  Cipro: Rash  Penicillin: Itching, Swelling  Amoxicillin: Itching, Swelling  HOME MEDICATIONS:  Home Medications: Medication Instructions Status  HumaLOG KwikPen 100 units/mL subcutaneous solution 6 unit(s) subcutaneous 3 times a day (before meals) If Blood Sugar > 150-200= 7 units, 200-250= 8 units, 250-300= 9 units, 300-350= 10 units Active  Vitamin D3 2000 intl units oral capsule 1 cap(s) orally once a day (in the morning) Active  pravastatin 20 mg oral tablet 1 tab(s) orally once a day (at bedtime) Active  clopidogrel 75 mg oral tablet 1 tab(s) orally once a day (in the morning) Active  amLODIPine 5 mg oral tablet 1 tab(s) orally once a day (in the morning) Active  gabapentin 300 mg oral capsule 1 cap(s) orally 2 times a day Active  sodium bicarbonate 650 mg oral tablet 1 tab(s) orally 2 times a day Active  clonidine 0.2 mg oral tablet 1 tab(s) orally 2 times a day Active  Levemir 100 units/mL subcutaneous solution 7 unit(s) subcutaneous once a day (at bedtime) Active  losartan 50 mg oral tablet 1 tab(s) orally once a day Active  pantoprazole 40 mg oral delayed  release tablet 1 tab(s) orally once a day (in the morning) before meal. Active  ferrous sulfate 325 mg oral tablet 1 tab(s) orally once a day Active  furosemide 20 mg oral tablet 1 tab(s) orally once a day Active  Renvela carbonate 800 mg oral tablet 2 tabs (1665m) orally 3 times a day (before meals) Active   Family and Social History:  Family History Non-Contributory   Social History negative tobacco, negative ETOH   Place of Living Home   Review of Systems:  ROS Pt not able to provide ROS   Medications/Allergies Reviewed Medications/Allergies reviewed   Physical Exam:  GEN well developed, well nourished   HEENT pink conjunctivae, moist oral mucosa   NECK No masses  trachea midline   RESP normal resp effort  crackles   CARD regular rate  no JVD   VASCULAR ACCESS AV fistula present  Good bruit  bruising and swelling around access sites   ABD denies tenderness  soft   GU no superpubic tenderness   LYMPH negative neck, negative axillae   EXTR negative cyanosis/clubbing, positive edema   SKIN normal to palpation   NEURO cranial nerves intact, motor/sensory function intact   PSYCH poor insight, agitated   LABS:  Laboratory Results: Hepatic:    28-Jul-15 09:07, Comprehensive Metabolic Panel  Bilirubin, Total 0.7  Alkaline Phosphatase 177  46-116  NOTE: New Reference Range  05/08/14  SGPT (ALT) 25  14-63  NOTE: New Reference Range  05/08/14  SGOT (AST) 18  Total Protein, Serum 7.4  Albumin, Serum 2.6  Lab:    26-Jul-15 16:28, ABG and Lactic Acid  pH (ABG) 7.31  7.35-7.45  NOTE: New Reference Range  05/12/14  PCO2 36  32-48  NOTE: New Reference Range  05/12/14  PO2 97  83-108  NOTE: New Reference Range  05/12/14  FiO2 35  Base Excess -7.4  -2.0-3.0  NOTE: New Reference Range  05/12/14  HCO3 18.1  21-28  NOTE: New Reference Range  05/12/14  O2 Saturation 98.4  O2 Device BIPAP  Specimen Site (ABG)   RT RADIAL  Specimen Type (ABG) ARTERIAL   Patient Temp (ABG) 37.0  PSV 10  PEEP 5.0  Result(s) reported on 13 May 2014 at 04:35PM.  General Ref:    27-Jul-15 10:24, HBsAg  HBsAg   ========== TEST NAME ==========  ========= RESULTS =========  = REFERENCE RANGE =    HEPATITIS B SURFACE AG    HBsAg Screen  HBsAg Screen                    [   Negative             ]          Negative                  [facility]            No: [id number]            [address], [place], [place] [address]            Lindon Romp, MD         [phone]     Result(s) reported on 15 May 2014 at 05:48AM.  Cardiology:    26-Jul-15 15:27, ED ECG  Ventricular Rate 62  Atrial Rate 62  P-R Interval 132  QRS Duration 88  QT 414  QTc 420  R Axis 66  T Axis 56  ECG interpretation   Normal sinus rhythm  ST abnormality, possible digitalis effect  Abnormal ECG  When compared with ECG of 03-May-2013 14:18,  No significant change was found  ----------unconfirmed----------  Confirmed by OVERREAD, NOT ([id number]), editor PEARSON, BARBARA ([id number]) on 05/14/2014 1:55:03 PM  ED ECG   Routine Chem:    26-Jul-15 10:07, Basic Metabolic Panel (w/Total Calcium)  Glucose, Serum 224  BUN 106  Creatinine (comp) 7.11  Sodium, Serum 130  Potassium, Serum 4.9  Chloride, Serum 97  CO2, Serum 19  Calcium (Total), Serum 8.2  Anion Gap 14  Osmolality (calc) 301  eGFR (African American) 6  eGFR (Non-African American) 5  eGFR values <34m/min/1.73 m2 may be an indication of chronic  kidney disease (CKD).  Calculated eGFR is useful in patients with stable renal function.  The eGFR calculation will not be reliable in acutely ill patients  when serum creatinine is changing rapidly. It is not useful in   patients on dialysis. The eGFR calculation may not be applicable  to patients at the low and high extremes of body sizes, pregnant  women, and vegetarians.    26-Jul-15 15:40, B-Type Natriuretic Peptide ([facility]  B-Type Natriuretic Peptide ([facility] [phone]  Result(s) reported on 13 May 2014 at 04:33PM.    26-Jul-15 15:40, Troponin I  Result Comment   TROPONIN - RESULTS VERIFIED BY REPEAT TESTING.   - CALLED TO DENA ROYSTER @ 15883ON 7/26   -  2015 CAF   - READ-BACK PROCESS PERFORMED.   Result(s) reported on 13 May 2014 at 04:42PM.    27-Jul-15 06:30, Basic Metabolic Panel (w/Total Calcium)  Glucose, Serum 160  BUN 108  Creatinine (comp) 7.39  Sodium, Serum 133  Potassium, Serum 4.7  Chloride, Serum 98  CO2, Serum 20  Calcium (Total), Serum 8.2  Anion Gap 15  Osmolality (calc) 304  eGFR (African American) 6  eGFR (Non-African American) 5  eGFR values <77mL/min/1.73 m2 may be an indication of chronic  kidney disease (CKD).  Calculated eGFR is useful in patients with stable renal function.  The eGFR calculation will not be reliable in acutely ill patients  when serum creatinine is changing rapidly. It is not useful in   patients on dialysis. The eGFR calculation may not be applicable  to patients at the low and high extremes of body sizes, pregnant  women, and vegetarians.    28-Jul-15 09:07, Comprehensive Metabolic Panel  Glucose, Serum 211  BUN 93  Creatinine (comp) 6.41  Sodium, Serum 137  Potassium, Serum 4.3  Chloride, Serum 99  CO2, Serum 22  Calcium (Total), Serum 8.5  Osmolality (calc) 309  eGFR (African American) 7  eGFR (Non-African American) 6  eGFR values <71mL/min/1.73 m2 may be an indication of chronic  kidney disease (CKD).  Calculated eGFR is useful in patients with stable renal function.  The eGFR calculation will not be reliable in acutely ill patients  when serum creatinine is changing rapidly. It is not useful in   patients on dialysis. The eGFR calculation may not be applicable  to patients at the low and high extremes of body sizes, pregnant  women, and vegetarians.  Anion Gap 16  Cardiac:    26-Jul-15 15:40, Troponin I  Troponin I 0.14  0.00-0.05  0.05 ng/mL or less: NEGATIVE   Repeat testing  in 3-6 hrs   if clinically indicated.  >0.05 ng/mL: POTENTIAL   MYOCARDIAL INJURY. Repeat   testing in 3-6 hrs if   clinically indicated.  NOTE: An increase or decrease   of 30% or more on serial   testing suggests a   clinically important change    28-Jul-15 09:07, Cardiac Panel  CK, Total 147  26-192  NOTE: NEW REFERENCE RANGE   11/20/2013  CPK-MB, Serum 1.2  Result(s) reported on 15 May 2014 at 09:46AM.  Routine UA:    26-Jul-15 17:12, Urinalysis  Color (UA) Yellow  Clarity (UA) Hazy  Glucose (UA) 50 mg/dL  Bilirubin (UA) Negative  Ketones (UA) Negative  Specific Gravity (UA) 1.009  Blood (UA) 1+  pH (UA) 5.0  Protein (UA)   100 mg/dL  Nitrite (UA) Negative  Leukocyte Esterase (UA) Negative  Result(s) reported on 13 May 2014 at 06:18PM.  RBC (UA) 1 /HPF  WBC (UA) 9 /HPF  Bacteria (UA) 1+  Epithelial Cells (UA) 1 /HPF  Result(s) reported on 13 May 2014 at 06:18PM.  Routine Coag:    26-Jul-15 15:40, Prothrombin Time  Prothrombin 13.3  INR 1.0  INR reference interval applies to patients on anticoagulant therapy.  A single INR therapeutic range for coumarins is not optimal for all  indications; however, the suggested range for most indications is  2.0 - 3.0.  Exceptions to the INR Reference Range may include: Prosthetic heart  valves, acute myocardial infarction, prevention of myocardial  infarction, and combinations of aspirin and anticoagulant. The need  for a higher or lower target INR must be assessed individually.  Reference: The  Pharmacology and Management of the Vitamin K   antagonists: the seventh ACCP Conference on Antithrombotic and  Thrombolytic Therapy. TDSKA.7681 Sept:126 (3suppl): [id number].  A HCT value >55% may artifactually increase the PT.  In one study,   the increase was an average of 25%.  Reference:  "Effect on Routine and Special Coagulation Testing Values  of Citrate Anticoagulant Adjustment in Patients with High HCT Values."  American  Journal of Clinical Pathology 2006;126:400-405.  Routine Hem:    26-Jul-15 15:40, Hemogram, Platelet Count  WBC (CBC) 14.0  RBC (CBC) 3.22  Hemoglobin (CBC) 10.1  Hematocrit (CBC) 31.8  Platelet Count (CBC) 207  Result(s) reported on 13 May 2014 at 03:55PM.  MCV 99  MCH 31.3  MCHC 31.7  RDW 14.3    27-Jul-15 03:22, CBC Profile  WBC (CBC) 9.9  RBC (CBC) 2.95  Hemoglobin (CBC) 9.4  Hematocrit (CBC) 28.8  Platelet Count (CBC) 183  MCV 98  MCH 31.9  MCHC 32.7  RDW 14.0  Neutrophil % 80.2  Lymphocyte % 7.6  Monocyte % 11.9  Eosinophil % 0.0  Basophil % 0.3  Neutrophil # 8.0  Lymphocyte # 0.8  Monocyte # 1.2  Eosinophil # 0.0  Basophil # 0.0  Result(s) reported on 14 May 2014 at 03:50AM.   RADIOLOGY:  Radiology Results: XRay:    26-Jul-15 15:55, Chest Portable Single View  Chest Portable Single View  REASON FOR EXAM:    sob  COMMENTS:       PROCEDURE: DXR - DXR PORTABLE CHEST SINGLE VIEW  - May 13 2014  3:55PM     CLINICAL DATA:  74 year old female with shortness of breath    EXAM:  PORTABLE CHEST - 1 VIEW    COMPARISON:  10/02/2012 and prior radiographs    FINDINGS:  Cardiomegaly and mild pulmonary edema identified.  Left basilar atelectasis versus scarring noted.    No definite pleural effusion or pneumothorax noted.    No acute bony abnormalities are present.     IMPRESSION:  Cardiomegaly with mild pulmonary edema.      Electronically Signed    By: Hassan Rowan M.D.    On: 05/13/2014 16:05       Verified By: Lura Em, M.D.,    28-Jul-15 10:03, Chest Portable Single View  Chest Portable Single View  REASON FOR EXAM:    interval change  COMMENTS:       PROCEDURE: DXR - DXR PORTABLE CHEST SINGLE VIEW  - May 15 2014 10:03AM     CLINICAL DATA:  Nausea    EXAM:  PORTABLE CHEST - 1 VIEW    COMPARISON:  Portable exam 0957 hr compared to 05/13/2014    FINDINGS:  Enlargement of cardiac silhouette.  Slight pulmonary vascular  congestion.    Increased pulmonary infiltrates bilaterally greater on RIGHT likely  asymmetric edema.    Questionable LEFT pleural effusion.    No pneumothorax.    Atheroscleroticcalcification aorta noted.    Diffuse osseous demineralization.     IMPRESSION:  Slightly increased pulmonary edema.  Electronically Signed    By: Lavonia Dana M.D.    On: 05/15/2014 10:26         Verified By: Burnetta Sabin, M.D.,  [place]:    05-Mar-15 10:26, Digital Screen Mammogram  PACS Image    26-Jul-15 15:55, Chest Portable Single View  PACS Image    28-Jul-15 10:03, Chest Portable Single View  PACS [place]:    05-Mar-15 10:26, Digital Screen  Mammogram  Digital Screen Mammogram  REASON FOR EXAM:    scr mammo no order  COMMENTS:       PROCEDURE: MAM - MAM DGTL SCREENING MAMMO W/CAD  - Dec 21 2013 10:26AM     CLINICAL DATA:  Screening.    EXAM:  DIGITAL SCREENING BILATERAL MAMMOGRAM WITH CAD    COMPARISON:  Previous exam(s).    ACR Breast Density Category b: There are scattered areas of  fibroglandular density.  FINDINGS:  There are no findings suspicious for malignancy. Images were  processed with CAD.     IMPRESSION:  No mammographic evidence of malignancy. A result letter of this  screening mammogram will be mailed directly to the patient.    RECOMMENDATION:  Screening mammogram in one year. (Code:[id number]-B-01Y)    BI-RADS CATEGORY  1: Negative.      Electronically Signed    By: Hassan Rowan M.D.    On: 12/21/2013 16:25         Verified By: Lura Em, M.D.,   ASSESSMENT AND PLAN:  Assessment/Admission Diagnosis ESRD, infiltration of AVF   Plan Will place Permcath and start dialysis through this to treat uremia May need fistulagram in 1-2 weeks after infiltration resolves   level 3   Electronic Signatures: Algernon Huxley (MD)  (Signed 30-Jul-15 15:24)  Authored: Chief Complaint and History, PAST MEDICAL/SURGICAL HISTORY, ALLERGIES, HOME  MEDICATIONS, Family and Social History, Review of Systems, Physical Exam, LABS, RADIOLOGY, Assessment and Plan   Last Updated: 30-Jul-15 15:24 by Algernon Huxley (MD)

## 2015-02-09 NOTE — Op Note (Signed)
PATIENT NAME:  Alexis Joseph, [name] MR#:  [id number] DATE OF BIRTH:  [birth year]  DATE OF PROCEDURE:  05/17/2014  PREOPERATIVE DIAGNOSES:   1.  End-stage renal disease.  2.  Infiltration of arteriovenous fistula with inability to use. 3.  Hypertension. 4.  History of stroke.  POSTOPERATIVE DIAGNOSES: 1.  End-stage renal disease.  2.  Infiltration of arteriovenous fistula with inability to use. 3.  Hypertension. 4.  History of stroke.  PROCEDURES: 1.  Ultrasound guidance for vascular access to right internal jugular vein.  2.  Fluoroscopic guidance for placement of catheter.  3. Placement of a 19 cm tip-to-cuff tunneled hemodialysis catheter via the right internal jugular vein.   SURGEON:  Annice NeedyJason S. Kawana Hegel, MD.  ANESTHESIA: Local with sedation.   BLOOD LOSS:  25 mL.   INDICATION FOR PROCEDURE: A 74 year old female with a progression end-stage renal disease. She has an AV fistula, but this developed infiltration at her dialysis yesterday and is unable to be used. She will need a PermCath for immediate dialysis access as well as over the next couple of weeks as the fistula heals.   DESCRIPTION OF THE PROCEDURE: The patient was brought to the vascular and interventional radiology suite. The patient's right neck and chest were sterilely prepped and draped and a sterile surgical field was created. The right internal jugular vein was visualized with ultrasound and found to be patent. It was then accessed under direct ultrasound guidance and a permanent image was recorded. A wire was placed. After a skin nick and dilatation, the peel-away sheath was placed over the wire.   I then turned my attention to an area under the clavicle. Approximately 2 fingerbreadths below the clavicle a small counter incision was created and we tunneled from the subclavicular incision to the access site. Using fluoroscopic guidance, a 19 cm tip-to-cuff tunneled hemodialysis catheter was selected, tunneled from the  subclavicular incision to the access site. It was then placed through the peel-away sheath and the peel-away sheath was removed. The catheter tips were parked in the right atrium. The appropriate distal connectors were placed. It withdrew blood well and flushed easily with heparinized saline and a concentrated heparin solution was then placed. It was secured to the chest wall with 2 Prolene sutures. The access incision was closed with a single 4-0 Monocryl. A 4-0 Monocryl pursestring suture was placed around the exit site. Sterile dressings were placed.    ____________________________ Annice NeedyJason S. Tanicia Wolaver, MD jsd:ds D: 05/17/2014 15:26:27 ET T: 05/17/2014 15:53:40 ET JOB#: [id number]  cc: Annice NeedyJason S. Ceceilia Cephus, MD, <Dictator> Annice NeedyJASON S Tylar Amborn MD ELECTRONICALLY SIGNED 05/29/2014 14:12

## 2015-02-09 NOTE — Consult Note (Signed)
   Comments   I spoke with pt's son. Updated him on pt's current condition including evidence for AMI. Son recognizes that pt is declining. He says that pt has had a very difficult time with dialysis and is questioning whether or not to continue this. We discussed pt returning home with hospice following. Son will think about this. Will follow up with him in AM.   Electronic Signatures: Andrae Claunch, Harriett SineNancy (MD)  (Signed 23-Sep-15 21:04)  Authored: Palliative Care   Last Updated: 23-Sep-15 21:04 by Ashir Kunz, Harriett SineNancy (MD)

## 2015-02-09 NOTE — H&P (Signed)
PATIENT NAME:  Alexis Joseph, HARTWELL MR#:  [id number] DATE OF BIRTH:  05-17-1941  DATE OF ADMISSION:  07/11/2014  PRIMARY CARE PHYSICIAN:  Teena Irani. Terance Hart, MD    CHIEF COMPLAINT: Sent in with fever.   HISTORY OF PRESENT ILLNESS: This is a 74 year old female with end-stage dialysis Monday, Wednesday, Friday with a catheter in her right chest, fistula in the left arm; dementia, stroke, hypertension, and diabetes. She has also been battling a urinary tract infection and has been treated with vancomycin for enterococcus. She was sent in from her facility for a fever, altered mental status. Hospitalist services were contacted for further evaluation. In the ER, her initial temperature was 101.2. blood pressure 106/56, pulse oximetry 79% on room air, respirations 24%, pulse 78. Urinalysis was positive for 3+ leukocyte esterase. Troponin was elevated at 14, hemoglobin 7.4. Chest x-ray read as a small left pleural effusion with left basilar atelectasis or infiltrate, mild vascular congestion. The patient is a poor historian and only able to give some simple answers. History obtained from son when he came in and old chart. The patient was made a DO NOT RESUSCITATE.   PAST MEDICAL HISTORY: End-stage renal disease on dialysis Monday, Wednesday, Friday;  hypertension, diabetes, stroke, dementia, UTIs.   PAST SURGICAL HISTORY: Dialysis fistula, left arm catheter right chest; right knee replacement, cholecystectomy, hysterectomy.   ALLERGIES: ACE INHIBITORS, ALTACE, AMOXICILLIN, CIPRO, PENICILLIN, AND PHENERGAN.   SOCIAL HISTORY: Currently in facility, no smoking, no alcohol.   FAMILY HISTORY: Unable to obtain from the patient secondary to altered mental status.   CURRENT MEDICATIONS: Include amlodipine 5 mg daily, clonidine 0.2 mg twice a day, Colace 100 mg every 12 hours, iron 325 mg daily, Humalog Kwikpen sliding scale 4 times a day, labetalol 100 mg twice a day, Levemir 7 units at bedtime, losartan 100 mg  daily, Neurontin 300 mg twice a day, nitroglycerin 0.4 mg every 5 minutes as needed for chest pain, Protonix 40 mg daily, Percocet 5/325 two tablets every 4 hours as needed for pain, Plavix 75 mg daily, pravastatin 20 mg at bedtime, senna 1 tablet every 12 hours, vitamin D 2000 International Units daily, Zofran 4 mg every 8 hours as needed for pain.   REVIEW OF SYSTEMS:  Difficult to obtain secondary to altered mental status.   PHYSICAL EXAMINATION: VITAL SIGNS: On presentation to the ER included a temperature of 101.2, pulse 78, respirations 24, blood pressure 106/56, pulse oximetry 79% on room air.   GENERAL: No respiratory distress.  HEENT:  Eyes: Conjunctivae and lids normal. Pupils equal, round, and reactive to light. Extraocular muscles intact. No nystagmus. Ears, nose, mouth and throat: Tympanic membranes: No erythema. Nasal mucosa: No erythema. Throat: No erythema, no exudate seen. Lips and gums: No lesions.  NECK: No JVD. No bruits. No lymphadenopathy. No thyromegaly. No thyroid nodules palpated.  RESPIRATORY:  Lungs clear to auscultation. Slight use of accessory muscles to breathe.  CARDIOVASCULAR SYSTEM: S1, S2 normal. No gallops, rubs, or murmurs heard. Carotid upstroke 2+ bilaterally. No bruits. Dorsalis pedis pulses 1+ bilaterally.  ABDOMEN: Soft, nontender. No organosplenomegaly. Normoactive bowel sounds. No masses felt.  LYMPHATIC: No lymph nodes in the neck.  MUSCULOSKELETAL: No clubbing. No cyanosis, on oxygen. Trace edema.   SKIN: No ulcers or lesions.  NEUROLOGIC: Cranial nerves II through XII grossly intact. Deep tendon reflexes 1+ bilateral lower extremities.  PSYCHIATRIC: The patient is alert, oriented to person and place.   LABORATORY AND RADIOLOGICAL DATA: Urinalysis 1+ blood, 3+ leukocyte esterase.  INR of 1.1. Troponin 14, magnesium 1.6, glucose 103, BUN 19, creatinine 4.67, sodium 137, potassium 3.1, chloride 96, CO2 of 34, calcium 7.9. Liver function tests: AST elevated  at 126, total protein 6.7. White blood cell count 15.0, hemoglobin and hematocrit 7.4 and 23.3, platelet count of 265,000. Blood cultures negative. Lactic acid 1. Chest x-ray: A small left pleural effusion and left basilar atelectasis or infiltrate.   ASSESSMENT AND PLAN: 1.  Clinical sepsis:  A couple of sources could be infected dialysis catheter in the right chest. I spoke with nephrology, and vascular surgery will end up removing this catheter. This could be a possible pneumonia, but less likely; could be a urinary tract infection since the patient has been battling urinary tract infections. We will get blood cultures, urine culture. Started on vancomycin, aztreonam, and Zithromax for now until cultures are back.  2.  Acute respiratory failure. Pulse oximetry 79% on room air. Currently, okay on 3 liters. We will give oxygen supplementation.  3.  Acute myocardial infarction, likely with sepsis. I will add aspirin. The patient is already on Plavix, statin, and labetalol. I spoke with Dr. Lady GaryFath, cardiology, to decide on heparin drip or not. The patient will be admitted to telemetry. Serial cardiac enzymes.  4.  Anemia. Looking back at old hemoglobins, the patient has been anemic in the past, likely anemia of chronic disease.  5.  Hypertension. Blood pressure on the lower side. We will continue the labetalol and hold all other medications.  6.  Dementia.  7.  History of cerebrovascular accident on Plavix.  8. Hypomagnesemia: We will replace magnesium IV.   TIME SPENT ON ADMISSION: Sixty minutes critical care time, coordination of all specialists including, nephrology, vascular surgery, and cardiology.    Overall prognosis is poor. The patient is a DO NOT RESUSCITATE. Palliative care consultation called.  Case also discussed with the patient's son who made the decision on the DO NOT RESUSCITATE.     ____________________________ [name], MD rjw:lr D: 07/11/2014 15:40:32 ET T: 07/11/2014  15:58:27 ET JOB#: [id number]  cc: [name], MD, <Dictator> Teena Iraniavid M. Terance HartBronstein, MD Salley ScarletICHARD J Joceline Hinchcliff MD ELECTRONICALLY SIGNED 07/12/2014 14:05

## 2015-02-09 NOTE — Consult Note (Signed)
Brief Consult Note: Diagnosis: UTI enterococcus, esrd, ams.   Patient was seen by consultant.   Consult note dictated.   Recommend further assessment or treatment.   Orders entered.   Comments: Would treat the enterococcus with a 10 day course of vanco since allergic to pcn and cipro.  This can be given at HD will repeat ua and ucx today.  Electronic Signatures: Dierdre HarnessFitzgerald, Catharine Kettlewell Patrick (MD)  (Signed 06-Aug-15 17:18)  Authored: Brief Consult Note   Last Updated: 06-Aug-15 17:18 by Dierdre HarnessFitzgerald, Marijean Montanye Patrick (MD)

## 2015-02-09 NOTE — Consult Note (Signed)
Present Illness 74 yo female with history of end stage renal isease who had hemodyalysis  admitted with fever and alterred mental status. She has been treatd with abx for enterococcus. She developed fever of 101 with weakness and increased confusion. She was brought to the er where she wasa noted to have a serum troponin of 14. She denies chest pain but is a somewhjat difficult historian. She was hypoxic on presentation to the er with improvement after oxygen and continued antibiotics. CXR does not reveal significant pulmonary edema. Subsequent troponins are reduced. Pt was recently admitted after becoming hypotensive and unresponsive with hemodialysis. Her av fistula is maturing. She is currently without complaint and hemodynamically stable eating dinner. She was felt to have a septic picture on presentation.   Physical Exam:  GEN well nourished, no acute distress   HEENT PERRL   NECK supple   RESP normal resp effort  no use of accessory muscles   CARD Normal, S1, S2  Murmur   Murmur Systolic   Systolic Murmur axilla   ABD denies tenderness  denies Flank Tenderness  no liver/spleen enlargement  normal BS   LYMPH negative neck   EXTR negative cyanosis/clubbing, negative edema   SKIN normal to palpation   PSYCH poor insight   Review of Systems:  Subjective/Chief Complaint fever of 101, hypotension after hemodialysis. Pt not able to give much meaningful history due to memory loss but denies any current sob or cp, eating dinner   General: No Complaints   Skin: No Complaints   ENT: No Complaints   Eyes: No Complaints   Neck: No Complaints   Respiratory: No Complaints   Cardiovascular: No Complaints   Gastrointestinal: No Complaints   Genitourinary: No Complaints   Vascular: No Complaints   Musculoskeletal: No Complaints   Neurologic: No Complaints   Hematologic: No Complaints   Endocrine: No Complaints   Psychiatric: No Complaints   Review of Systems: All  other systems were reviewed and found to be negative   ROS poor historian   Medications/Allergies Reviewed Medications/Allergies reviewed   Family & Social History:  Family and Social History:  Place of Living Nursing Home   EKG:  Interpretation nsr with no evidence of ischemia    Phenergan: Alt Ment Status, Swelling  Ace Inhibitors: Cough  Altace: Cough  Cipro: Rash  Penicillin: Itching, Swelling  Amoxicillin: Itching, Swelling   Impression Elderly female with history of cva, esrd on hd who was admitted after developing fever of 101, hypotension and relative hypoxia . She had a serum troonin drawn which revealed a level of 14. SHe is not a very good historian but denies chest pain. Subsequent troponins have reduced. EKG did not reveale injury current. She is on asa and plavix. She has a septic picture but has improved with current treatment and is relatively stable at present. Etiiolog of tropoin elevation is unclear but is likely secondary to demand ischemia in septic patient with esrd. She does not appera to be a candidate for invasive cardiac evaluation due to comorbid contition. Consideration for stopping hd has been reaised. Would not start heparin in this case but would conitnue with po asa and clopidogrel.   Plan 1. Continue with asa and clopidogrel 2. Would not start heparin drip at present. 3. Continue with labetolol and pravastatin 4. Agree with agressive treatment with abx. 5.Continue discussion regarding further hd. 6 Defer invasive cardiac evaluaiton at present given comorbid conditions. 7. Further recs pending course.   Electronic Signatures:  Dalia Heading (MD)  (Signed 24-Sep-15 01:31)  Authored: General Aspect/Present Illness, History and Physical Exam, Review of System, Family & Social History, EKG , Allergies, Impression/Plan   Last Updated: 24-Sep-15 01:31 by Dalia Heading (MD)

## 2015-02-09 NOTE — Discharge Summary (Signed)
PATIENT NAME:  Alexis Joseph, Alexis Joseph MR#:  [id number] DATE OF BIRTH:  Nov 01, 1940  DATE OF ADMISSION:  05/22/2014 DATE OF DISCHARGE:  05/25/2014  DISCHARGE DIAGNOSES:  1.  Systemic inflammatory response syndrome secondary to urinary tract infection.  2.  Hypertension.  3.  End-stage renal disease with hemodialysis.  4.  Gastroesophageal reflux disease. 5.  Type 2 diabetes mellitus.   DISCHARGE MEDICATIONS:  1.  Vancomycin 750 mg at nephrology with hemodialysis, nephrology to determine dose likley  10 doses,.  2.  Pravastatin 20 mg daily.  3.  Levemir Flexpen 7 units daily at bedtime. 4.  Ferrous sulfate 325 mg p.o. daily.  5.  Humalog Kwikpen before meals and at bedtime 4 times daily.  6.  Plavix 75 mg p.o. daily.  7.  Vitamin D 2000 units daily.  8.  Zofran 4 mg every 8 hours p.r.n. for nausea  9.  Renvela 800 mg 2 tablets t.i.d. 10.  Pantoprazole 40 mg p.o. daily.  11.  Losartan 50 mg p.o. daily.  12.  Clonidine 0.2 mg p.o. b.i.d.  13.  Neurontin 300 mg p.o. b.i.d.   CONSULTATIONS: Seen by Dr. Sampson Goon.   HOSPITAL COURSE:  22.  A 74 year old female patient who came in because of altered mental status. The patient had been admitted to the hospitalist service for altered mental status. The patient's initial white count was 12.8 on admission and urine had pus on admission, so patient's urine showed more 100,000 colonies of enterococci.   THE PATIENT DOES HAVE MULTIPLE ALLERGIES TO CIPRO AND PENICILLIN.  The patient was started on vancomycin, so Dr. Sampson Goon recommended that she continue vancomycin for 10 days.   2  The patient's other diagnoses include history of ESRD on hemodialysis, and the patient was admitted on July 26 and discharged on August 3. At that time, the patient had problems with her AV fistula, so she had a PermCath placed.  This was done on July 30, and right now she hemodialysis through [facility] and she had infiltration of AV fistula and she was not able to get   dialysis because of that, so she had a PermCath placed on July 30 and patient right now is getting hemodialysis sessions, and the patient will get vancomycin with hemodialysis.   3.  Diabetes mellitus type 2.  Patient's p.o. intake continues to be poor; however, it is much better tan before, and patient can continue her small dose Lantus insulin and sliding scale coverage.   4.  Mild hypertension. The patient's blood pressure is better with Losartan and  Norvasc.   5.  Sepsis present on admission secondary to urinary tract infection with leukocytosis. The patient does have hypertension. Blood pressure is better. She is on Losartan and clonidine.   DISCHARGE VITAL SIGNS: Temperature is 98.6, heart rate 72, blood pressure is 170/68, and saturations 98% on room air. The patient does have elevated blood pressure is in between. She has clonidine and Norvasc that she can continue along with losartan. The patient is on losartan and clonidine.   PERTINENT LABORATORY DATA: White count yesterday 11.3, hemoglobin 8.7, hematocrit 26.5, and platelets 180,000.  The patient's creatinine is 4.3, BUN 24, sodium of 134, potassium is 4, chloride 98, bicarbonate 31.   The patient's urine was cloudy with more than 100,000 colonies of VRE in the urine.   Blood cultures were negative. The patient's initial white count was 12.8.   TIME SPENT ON DISCHARGE PREPARATION: More than 30 minutes.   The patient  is going to [facility]. Discussed the plan with the patient's husband.  CODE STATUS: FULL CODE.   ALLERGIES: SHE IS ALLERGIC TO ALTACE, AMOXICILLIN, CIPRO, AND ACE INHIBITORS.    ____________________________ Katha HammingSnehalatha Lexee Brashears, MD sk:ts D: 05/26/2014 11:52:37 ET T: 05/26/2014 12:44:29 ET JOB#: [id number]  cc: Katha HammingSnehalatha Malley Hauter, MD, <Dictator> Katha HammingSNEHALATHA Dusti Tetro MD ELECTRONICALLY SIGNED 06/12/2014 15:41

## 2015-02-09 NOTE — Discharge Summary (Signed)
PATIENT NAME:  Alexis Joseph, Alexis Joseph MR#:  [id number] DATE OF BIRTH:  April 06, 1941  DATE OF ADMISSION:  06/11/2014 DATE OF DISCHARGE:  06/14/2014  ADDENDUM  ADMITTING DIAGNOSIS: Cardiac arrest.  DISCHARGE DIAGNOSES: Cardiac arrest versus syncope.  The patient was about to be discharged on the 26th of August 2015; however, her blood pressure remained very high at around 190 systolic and we were waiting for consultation with vascular surgeon because of her bilateral carotid stenosis. Dr. Wyn Quakerew saw the patient in consultation on the 26th of August 2015, however, he felt that the patient's ultrasound reading very likely is overrated, and although the patient does have moderate internal carotid artery disease, he did not feel that any kind of procedures are recommended at this time. He did not feel that the patient could have syncopized because of this. He recommended to follow up with him as an outpatient. As Dr. Wyn Quakerew did not feel that the patient's carotid stenosis was a culprit for her episode of unresponsiveness, we felt that the patient may have had actually an arrhythmia leading to cardiac arrest. We felt that the patient's arrhythmia could have been related to hypokalemia.   DISCHARGE DIAGNOSES: 1.  Cardiac arrest due to suspected arrhythmia due to hypokalemia.  2.  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 Moderate stenosis per Dr. Wyn Quakerew. No surgical intervention needed at this time. However, follow-up as outpatient is recommended.  3.  Hypokalemia, resolved.  4.  Leukocytosis, resolved.  5.  Malignant hypertension.  6.  End-stage renal disease with hemodialysis Mondays, Wednesdays and Fridays through permanent catheter in right chest.  7.  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  8.  Insulin-dependent diabetes mellitus.  9.  Elevated transaminases of unclear etiology.  10.  Anemia of chronic kidney disease. 11. Secondary hyperparathyroidism.  12.  Stroke.   13.  Dementia.   DISCHARGE CONDITION: Stable.   DISCHARGE MEDICATIONS: The patient is to resume clonidine 0.2 mg twice daily, pantoprazole 40 mg p.o. daily, vitamin D3 2,000 units once daily, Plavix 75 mg p.o. daily, Humalog quick pen subcutaneously 4 times daily, sliding scale, FeroSul 325 mg once daily, Levemir 7 units subcutaneously at bedtime, Pravachol 20 mg p.o. daily, Neurontin 300 mg p.o. twice daily, Zofran 4 mg every 8 hours as needed, losartan 100 mg p.o. daily, amlodipine 5 mg p.o., senna 1 tablet twice daily as needed, docusate sodium 100 mg p.o. twice daily as needed, and new medication labetalol 100 mg p.o. twice daily. This is new medication since the patient's blood pressure still was very poorly controlled despite advancement of her losartan and adding amlodipine.   HOME OXYGEN: None.   DIET: 2 grams salt, low-fat, low-cholesterol, carbohydrate-controlled diet, hemodialysis diet with 1,500 mL fluid restriction.   ACTIVITY LIMITATIONS: As tolerated.  DISCHARGE INSTRUCTIONS: Followup appointment with Dr. Sampson GoonFitzgerald in 2 days after discharge, Dr. Wyn Quakerew in the next 1 week after discharge.  Of note, the patient's blood pressure, as mentioned above, was very poorly controlled on the 26th of August 2015. Labetalol was added and her blood pressure has have improved. On the day of discharge, 27th of August 2015, the patient's vital signs: Temperature was 98.6, pulse was 68, respiratory rate was 20, blood pressure 162/52 and saturation was 98% on room air at rest.   The patient felt good, did not complain of any significant discomfort, and was ready to be discharged to skilled nursing facility, [facility], for further management of her chronic medical problems.  TIME SPENT: 40 minutes.  ____________________________ Katharina Caper, MD rv:sb Joseph: 06/14/2014 11:55:34 ET T: 06/14/2014 12:22:10 ET JOB#: [id number]  cc: Katharina Caper, MD, <Dictator> Stann Mainland. Sampson Goon, MD Katharina Caper  MD ELECTRONICALLY SIGNED 07/07/2014 17:46

## 2015-02-09 NOTE — Op Note (Signed)
PATIENT NAME:  Alexis Joseph, Alexis Joseph MR#:  [id number] DATE OF BIRTH:  Sep 06, 1941  DATE OF PROCEDURE:  02/12/2014  PREOPERATIVE DIAGNOSES:   1.  Chronic kidney disease stage V.  2. Poorly maturing left arm arteriovenous fistula with noninvasive study showing perianastomotic stenosis.  3.  Diabetes.  4.  Hyperlipidemia.   POSTOPERATIVE DIAGNOSES: 1.  Chronic kidney disease stage V.  2. Poorly maturing left arm arteriovenous fistula with noninvasive study showing perianastomotic stenosis.  3.  Diabetes.  4.  Hyperlipidemia.   PROCEDURES PERFORMED:  1.  Ultrasound guidance for vascular access to left brachiocephalic AV fistula in a retrograde fashion.  2.  Left upper extremity fistulogram.  3. Percutaneous transluminal angioplasty of perianastomotic stenosis with 7 mm and 8 mm diameter angioplasty balloon.   SURGEON: Annice NeedyJason S. Dew, M.Joseph.   ANESTHESIA: Local with moderate conscious sedation.   ESTIMATED BLOOD LOSS: 25 mL.   FLUOROSCOPY TIME: Approximately 2 minutes.   CONTRAST USED:  20 mL.   INDICATION FOR PROCEDURE: A 74 year old female who is stage V chronic kidney disease nearing dialysis dependence. She has perianastomotic stenosis, which is limiting the maturation of the AV fistula and we are planning to treat this to improve its flow so this can be used for dialysis once she starts. Risks and benefits were discussed. Informed consent was obtained.  DESCRIPTION OF PROCEDURE:  The patient is brought to the vascular suite. The left upper extremity was sterilely prepped and draped and a sterile surgical field was created. A micropuncture wire and sheath were then placed, and permanent image was recorded. I upsized to a 6-French sheath and gave the patient intravenous heparin.  Imaging was performed with a Kumpe catheter at the arterial anastomosis which showed about a 70% to 75% stenosis in the swing point of the cephalic vein or about 2 cm beyond the anastomosis. The remainder of the fistula  was patent. There was an area at the subclavian vein confluence, which was mildly irregular but had what appeared to be no more than 30% stenosis. I crossed the lesion with a Magic Torque wire. I treated initially with a 7 mm and then with an 8 mm diameter high-pressure angioplasty balloon. A waist was taken which resolved with angioplasty. Following angioplasty, the Kumpe catheter was replaced. Imaging was performed, which showed about a 25% to 30% residual stenosis was all that was left with good thrill in the AV fistula and the remainder patent. At this point, I elected to terminate the procedure. The sheath was removed, 4-0 Monocryl pursestring suture was placed. Pressure was held. Sterile dressing was placed. The patient tolerated the procedure well and was taken to the recovery room in stable condition.      ____________________________ Annice NeedyJason S. Dew, MD jsd:dmm Joseph: 02/12/2014 09:23:12 ET T: 02/12/2014 09:51:02 ET JOB#: [id number]  cc: Annice NeedyJason S. Dew, MD, <Dictator> Molli Barrowsaven Voora, MD Stann Mainlandavid P. Sampson GoonFitzgerald, MD Annice NeedyJASON S DEW MD ELECTRONICALLY SIGNED 02/22/2014 14:18

## 2015-02-09 NOTE — Discharge Summary (Signed)
PATIENT NAME:  Alexis Joseph, Alexis Joseph MR#:  [id number] DATE OF BIRTH:  02/12/1941  DATE OF ADMISSION:  05/13/2014 DATE OF DISCHARGE:    ADDENDUM  This is an addendum to a discharge summary originally dictated July 30. The patient was not discharged on that day as she required placement of a hemodialysis catheter. This was successfully done. She then underwent dialysis 2 more times, and is getting a final dialysis today, August 3, prior to being discharged. She will resume dialysis on a Monday, Wednesday, Friday schedule, at [facility]. She will be discharged on her medications as per her [facility] physician discharge instructions.    ____________________________ Stann Mainlandavid P. Sampson GoonFitzgerald, MD dpf:jr Joseph: 05/21/2014 13:55:36 ET T: 05/21/2014 14:36:07 ET JOB#: [id number]  cc: Stann Mainlandavid P. Sampson GoonFitzgerald, MD, <Dictator> Hassan Blackshire Sampson GoonFITZGERALD MD ELECTRONICALLY SIGNED 05/25/2014 22:05

## 2015-02-09 NOTE — Discharge Summary (Signed)
PATIENT NAME:  Alexis Joseph, [name] MR#:  [id number] DATE OF BIRTH:  [birth year]  DATE OF ADMISSION:  05/13/2014 DATE OF DISCHARGE:  05/17/2014  REFERRING PHYSICIAN: Dr. Thedore MinsSingh in nephrology.   DISCHARGE DIAGNOSES: 1.  End-stage renal disease, progressive to need for dialysis.  2.  Malignant hypertension.  3.  Hypoxia.  4.  Nausea.   HISTORY OF PRESENT ILLNESS: Please see initial history and physical for details. Briefly, this is a 74 year old female with progressive renal disease, hypertension and diabetes. She was admitted on the 26th through the Emergency Room with shortness of breath and found to be volume overloaded. The patient had had an AV fistula placed that was mature in anticipation of upcoming dialysis.   HOSPITAL COURSE:  1.  Respiratory failure. The patient was initially in the intensive care unit and on BiPAP. With diuresis and dialysis, her respiratory failure improved and she was decreased to 2 liters by nasal cannula.  2. End-stage renal disease. The patient underwent her first dialysis sessions while in the hospital. Clinically, she tolerated it well; however, on an attempted third day of dialysis, her AV fistula was malfunctioning. She will need to have a hemodialysis catheter placed.  3.  Nausea. The patient had initial nausea. LFTs were normal and she was treated with Zofran and this resolved. It was felt likely to be due to uremia.  4.  Diabetes. This was managed with her home insulin and sliding scale coverage.  5.  Malignant hypertension. Initially, she improved with IV hydralazine and nitro paste. She was also given Lasix and had diuresis. Her home meds including losartan and Norvasc were restarted. She was also placed on half of her outpatient clonidine dose.   DISCHARGE MEDICATIONS: Please see [facility] physician discharge instructions for details. Briefly, she will be restarting her outpatient antihypertensives and diabetes meds. She will continue her renal medications for  end-stage renal disease as well.   DISCHARGE DIET: End-stage renal disease diet.   DISCHARGE OXYGEN:  The patient will be discharged on 2 liters per nasal cannula to be weaned as tolerated.   DISCHARGE CODE STATUS: FULL CODE.   FOLLOWUP:  The patient will follow up with Dr. Sampson GoonFitzgerald and Dr. Thedore MinsSingh within 2 weeks.   This discharge took 35 minutes.   ____________________________ Stann Mainlandavid P. Sampson GoonFitzgerald, MD dpf:dmm D: 05/17/2014 13:22:12 ET T: 05/17/2014 13:43:08 ET JOB#: [id number]  cc: Stann Mainlandavid P. Sampson GoonFitzgerald, MD, <Dictator> Chrissy Ealey Sampson GoonFITZGERALD MD ELECTRONICALLY SIGNED 05/25/2014 22:05

## 2015-02-09 NOTE — Discharge Summary (Signed)
PATIENT NAME:  Alexis Joseph, Alexis Joseph MR#:  [id number] DATE OF BIRTH:  10/16/1941  DATE OF ADMISSION:  06/11/2014 DATE OF DISCHARGE:  06/13/2014   ADMITTING DIAGNOSIS: Cardiac arrest versus syncope.   DISCHARGE DIAGNOSES:  1. Syncope.  2. Bilateral internal carotid artery stenosis of 50% to 69%.  3. Hypokalemia.  4. Leukocytosis.  5. Malignant hypertension.  6. End-stage renal disease; hemodialysis Mondays, Wednesdays, Fridays. PermCath in the right chest, AV fistula in left arm.  7. Insulin-dependent diabetes mellitus.  8. Elevated transaminases of unclear etiology.  9. Anemia of chronic disease.  10. Secondary hyperparathyroidism.  11. Stroke with dementia.   DISCHARGE CONDITION: Fair.   DISCHARGE MEDICATIONS: The patient is to resume his outpatient medications which are clonidine 0.2 mg twice daily, pantoprazole 40 mg p.o. daily, vitamin D3 2000 units once daily, Plavix 75 mg p.o. daily, Humalog KwikPen subcutaneously as needed sliding scale, FeroSul 325 mg once daily, Levemir FlexPen 7 units subcutaneously at bedtime, Pravachol 20 mg p.o. daily, Neurontin 300 mg p.o. twice daily, Zofran 4 mg every 8 hours as needed, losartan 100 mg p.o. daily this is a new dose.   NEW MEDICATIONS: Amlodipine 5 mg per day, Senna 1 tablet twice daily as needed, docusate sodium 100 mg twice daily as needed.   HOME OXYGEN: None.   DIET: 2 grams sodium, low-cholesterol, low carbohydrate-controlled hemodialysis diet with 1500 mL of fluid restriction.   ACTIVITY LIMITATIONS: As tolerated,  physical therapy.   FOLLOWUP APPOINTMENT: With Dr. Sampson Goon in 2 days after discharge.   CONSULTANTS: Care management, social work, Dr. Lady Gary, Dr. Krista Blue.   RADIOLOGIC STUDIES: Chest x-ray, portable single view, 06/11/2014, showed enlargement of cardiac silhouette with pulmonary vascular congestion with basal atelectasis and mild chronic bronchitic changes were noted.   Carotid ultrasound, bilateral, 06/11/2014,  revealed a moderate to large amount of bilateral atherosclerotic plaque, right subjectively greater than left, resulting in elevated peak systolic velocities within the bilateral internal carotid arteries compatible with 50% to 69% luminal narrowing range bilaterally. Further evaluation could be performed with CT if clinically indicated.   Echocardiogram, 06/12/2014, showed left ventricular ejection fraction by visual estimation 55% to 60%, mild concentric left ventricular hypertrophy, mildly increased left ventricular septal thickness, decreased left ventricular internal cavity size, mild mitral valve regurgitation, mildly increased left ventricular posterior wall thickness, mild tricuspid regurgitation.  HISTORY OF PRESENT ILLNESS AND HOSPITAL COURSE: The patient is a 74 year old female with history of end-stage renal disease, who presented to the hospital apparently unresponsive and was resuscitated in the hemodialysis center. Please refer to Dr. Arlys John admission note on 06/11/2014. On arrival to the hospital, the patient's vitals were stable with temperature of 98.3, pulse was 85, respirations were 18, blood pressure 204/68, saturation was 94% on oxygen therapy. Generally, the physical examination was unremarkable.   The patient's laboratory data done on admission revealed elevated beta-type natriuretic peptide of 10,226, glucose of 116, creatinine of 3.58, potassium 2.9, otherwise unremarkable. The patient's liver enzymes revealed albumin level of 2.5, alkaline phosphatase 205, AST 39.   Cardiac enzymes, first set was negative. The second set revealed troponin elevation to 0.18, and third set troponin 0.16. MB fraction as well as CK levels were within normal limits.   White blood cell count on admission, 06/11/2014, was elevated to 12.4, hemoglobin was 9.5, and platelet count was 205,000. Coagulation panel was unremarkable.  ABGs were done on room air, pH was 7.45, pCO2 was 43, pO2 was 66,  saturation was 94.9% with a base excess  of 5.2 and bicarbonate level of 29.9.   EKG showed normal sinus rhythm at 80 beats per minute, minimal voltage criteria for left ventricular hypertrophy; otherwise, nonspecific ST-T changes.   The patient was admitted to the hospital for further evaluation. She had blood cultures taken on the day of admission, 06/11/2014, which remained negative through 06/13/2014. Initially the patient was initiated on antibiotics because of elevated white blood cell count, as well as episodes of syncope. Her blood glucose levels, as well as blood pressure were very closely followed. The patient was seen by cardiologist, as well as nephrologist.   Cardiologist, Dr. Lady GaryFath, who saw the patient in consultation, thought the patient likely had a syncopal episode of unclear etiology. He felt that this patient could have been transiently hypotensive post dialysis, which was symptomatic due to relative carotid disease. He recommended to continue telemetry and hemodialysis, as needed. Repeat potassium levels, which were done while she was in the hospital. Also ambulate and follow symptoms, as well as hemodynamics. Consider Holter monitor placement at discharge, as well as vascular outpatient evaluation. The patient was ambulated and had no significant abnormalities. Her blood pressure medications were advanced, and her blood pressure improved from 200 on admission to 160 on the day of discharge, 06/13/2014. The patient is to continue advanced doses of blood pressure medications.   She is to follow up with her primary care physician, Dr. Sampson GoonFitzgerald. She is also to follow up with vascular surgeon, Dr. Wyn Quakerew, who saw her in the hospital. Dr. Wyn Quakerew felt that carotid stenosis is unlikely couse of syncope, recommended to treat with aspirin and statin and to follow up with him for outpatient Duplex study to follow progression of  ICA stenosis.   The patient remained asymptomatic on telemetry during her  stay in the hospital time from the 06/11/2014 through 06/13/2014. We do not feel the patient needs a Holter monitor at this time.   In regards to leukocytosis, the patient initially was initiated on antibiotics; however, the patient's blood cultures remained stable and she remained afebrile, so antibiotics have been discontinued.   In regards to end-stage renal disease, the patient is going to receive hemodialysis today, on 06/13/2014, and then she will be discharged back to skilled nursing facility at [facility] to continue her physical therapy and care of her chronic medical problems.   In regards to diabetes mellitus, insulin-dependent, the patient is to continue her insulin doses. The patient's overall glucose levels remained stable while she was in the hospital and never trended down below 129, but also noted increasing high to 224. The patient is to continue diabetic hemodialysis diet.   Regarding anemia of chronic disease, secondary hyperparathyroidism, the patient is to continue follow-up with nephrology as outpatient.   On the day of discharge, the patient felt satisfactory,  did not complain of any significant discomfort. She is being discharged to a skilled nursing facility to continue her care.   Her vital signs on the day of discharge, temperature was 98.9, pulse was 76, respiration was 18, blood pressure 163/75, saturation was 93% to 95% on room air at rest.   TIME SPENT: 40 minutes.    ____________________________ Katharina Caperima Vanessa Kampf, MD rv:JT D: 06/13/2014 09:44:44 ET T: 06/13/2014 11:00:27 ET JOB#: [id number]  cc: Katharina Caperima Shauniece Kwan, MD, <Dictator> Stann Mainlandavid P. Sampson GoonFitzgerald, MD Katharina CaperIMA Jahmal Dunavant MD ELECTRONICALLY SIGNED 07/07/2014 17:45

## 2015-04-26 IMAGING — CR DG CHEST 2V
1 series · 2 of 2 positions shown · non-contrast
Comparison: 06/11/2014

CLINICAL DATA: Chest pain

EXAM:
CHEST  2 VIEW

[Series 1: dxr chest pa (or ap) and lateral · 0.14mm/px · 2 of 2 slices shown]
[im 1/2]
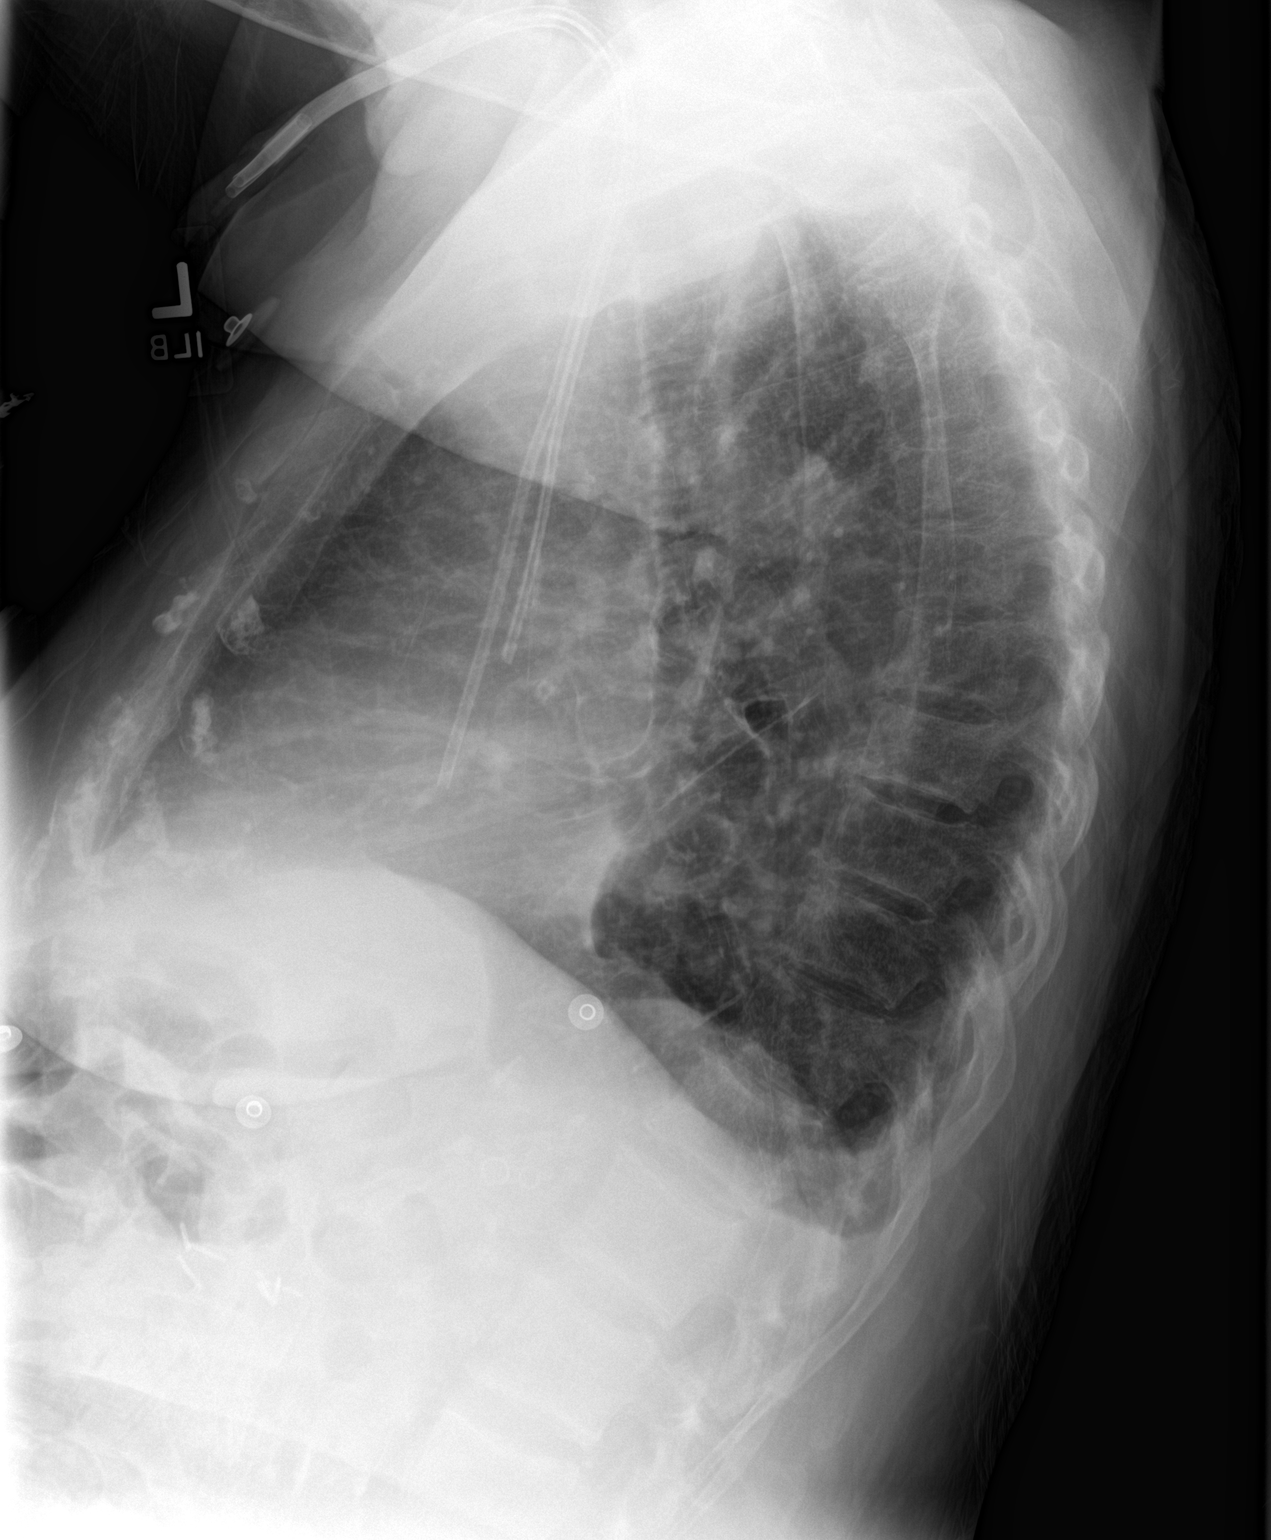
[im 2/2]
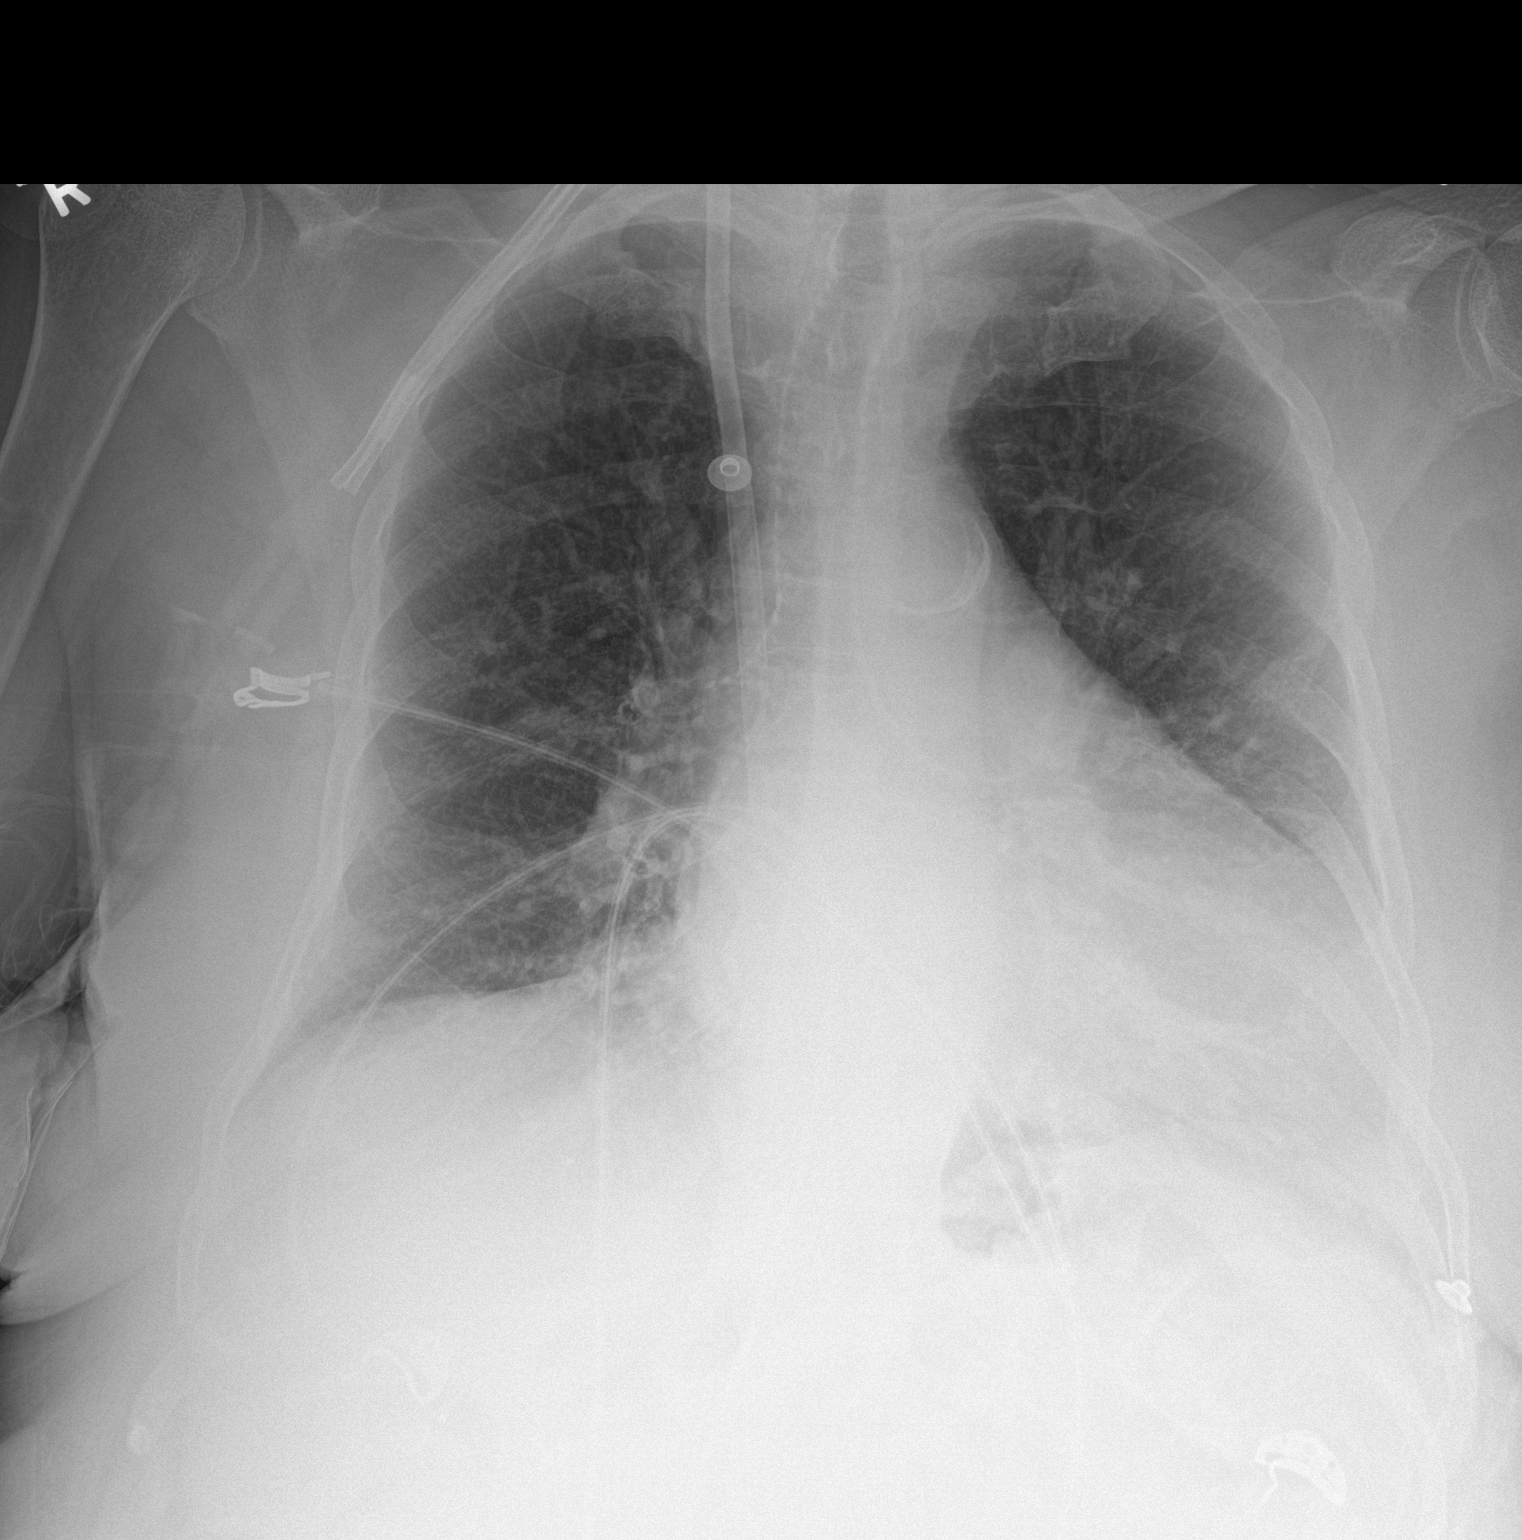

[2 of 2 positions shown; findings below may reference images not displayed]

FINDINGS: Cardiac shadow is stable but mildly enlarged. A dialysis catheter is
again seen. The lungs are well aerated bilaterally without focal
infiltrate or sizable effusion. No acute bony abnormality is noted.
IMPRESSION: Stable cardiomegaly.  No acute abnormality seen.
